# Patient Record
Sex: Female | Born: 1955 | Hispanic: Yes | Marital: Single | Smoking: Never smoker
Health system: Southern US, Community
[De-identification: ages and names within clinical notes are randomized; demographics above are authoritative.]

## PROBLEM LIST (undated history)

## (undated) DIAGNOSIS — R112 Nausea with vomiting, unspecified: Secondary | ICD-10-CM

## (undated) DIAGNOSIS — Z9889 Other specified postprocedural states: Secondary | ICD-10-CM

## (undated) DIAGNOSIS — E119 Type 2 diabetes mellitus without complications: Secondary | ICD-10-CM

## (undated) DIAGNOSIS — M1990 Unspecified osteoarthritis, unspecified site: Secondary | ICD-10-CM

## (undated) DIAGNOSIS — E785 Hyperlipidemia, unspecified: Secondary | ICD-10-CM

## (undated) DIAGNOSIS — C801 Malignant (primary) neoplasm, unspecified: Secondary | ICD-10-CM

## (undated) DIAGNOSIS — C55 Malignant neoplasm of uterus, part unspecified: Secondary | ICD-10-CM

## (undated) DIAGNOSIS — D649 Anemia, unspecified: Secondary | ICD-10-CM

## (undated) DIAGNOSIS — R0609 Other forms of dyspnea: Secondary | ICD-10-CM

## (undated) DIAGNOSIS — I10 Essential (primary) hypertension: Secondary | ICD-10-CM

## (undated) DIAGNOSIS — T8859XA Other complications of anesthesia, initial encounter: Secondary | ICD-10-CM

## (undated) DIAGNOSIS — N159 Renal tubulo-interstitial disease, unspecified: Secondary | ICD-10-CM

## (undated) HISTORY — PX: BREAST EXCISIONAL BIOPSY: SUR124

---

## 1995-08-20 HISTORY — PX: REDUCTION MAMMAPLASTY: SUR839

## 2000-08-19 HISTORY — PX: UTERINE FIBROID SURGERY: SHX826

## 2004-09-04 ENCOUNTER — Ambulatory Visit: Payer: Self-pay | Admitting: Family Medicine

## 2006-03-24 ENCOUNTER — Emergency Department: Payer: Self-pay | Admitting: Emergency Medicine

## 2006-08-26 ENCOUNTER — Ambulatory Visit: Payer: Self-pay | Admitting: Family Medicine

## 2006-09-08 ENCOUNTER — Ambulatory Visit: Payer: Self-pay | Admitting: Family Medicine

## 2007-01-20 ENCOUNTER — Ambulatory Visit: Payer: Self-pay | Admitting: Family Medicine

## 2007-10-27 ENCOUNTER — Ambulatory Visit: Payer: Self-pay | Admitting: Internal Medicine

## 2007-11-04 ENCOUNTER — Emergency Department: Payer: Self-pay | Admitting: Emergency Medicine

## 2008-05-10 ENCOUNTER — Ambulatory Visit: Payer: Self-pay | Admitting: Family Medicine

## 2008-08-04 ENCOUNTER — Emergency Department: Payer: Self-pay

## 2009-03-29 ENCOUNTER — Ambulatory Visit: Payer: Self-pay | Admitting: Gastroenterology

## 2009-05-09 ENCOUNTER — Emergency Department: Payer: Self-pay | Admitting: Emergency Medicine

## 2009-12-11 ENCOUNTER — Ambulatory Visit: Payer: Self-pay | Admitting: Family Medicine

## 2009-12-14 ENCOUNTER — Ambulatory Visit: Payer: Self-pay | Admitting: Family Medicine

## 2010-07-05 ENCOUNTER — Ambulatory Visit: Payer: Self-pay | Admitting: Family Medicine

## 2010-07-20 ENCOUNTER — Ambulatory Visit: Payer: Self-pay | Admitting: Family Medicine

## 2011-04-24 ENCOUNTER — Ambulatory Visit: Payer: Self-pay | Admitting: Family Medicine

## 2011-07-23 ENCOUNTER — Emergency Department: Payer: Self-pay | Admitting: Internal Medicine

## 2012-07-07 ENCOUNTER — Ambulatory Visit: Payer: Self-pay | Admitting: Primary Care

## 2012-09-15 ENCOUNTER — Ambulatory Visit: Payer: Self-pay | Admitting: Primary Care

## 2012-10-07 ENCOUNTER — Ambulatory Visit: Payer: Self-pay | Admitting: Surgery

## 2012-10-07 LAB — BASIC METABOLIC PANEL
BUN: 13 mg/dL (ref 7–18)
Calcium, Total: 9.2 mg/dL (ref 8.5–10.1)
Chloride: 105 mmol/L (ref 98–107)
Creatinine: 0.44 mg/dL — ABNORMAL LOW (ref 0.60–1.30)
EGFR (African American): >60
EGFR (Non-African Amer.): >60
Glucose: 101 mg/dL — ABNORMAL HIGH (ref 65–99)
Osmolality: 280 (ref 275–301)
Sodium: 140 mmol/L (ref 136–145)

## 2012-10-07 LAB — CBC WITH DIFFERENTIAL/PLATELET
Basophil #: 0.1 10*3/uL (ref 0.0–0.1)
Basophil %: 0.8 %
Eosinophil %: 2.7 %
HGB: 14 g/dL (ref 12.0–16.0)
Lymphocyte #: 2.7 10*3/uL (ref 1.0–3.6)
MCHC: 33 g/dL (ref 32.0–36.0)
MCV: 88 fL (ref 80–100)
Neutrophil #: 7.4 10*3/uL — ABNORMAL HIGH (ref 1.4–6.5)
RBC: 4.81 10*6/uL (ref 3.80–5.20)
RDW: 14 % (ref 11.5–14.5)
WBC: 11.2 10*3/uL — ABNORMAL HIGH (ref 3.6–11.0)

## 2012-10-13 ENCOUNTER — Ambulatory Visit: Payer: Self-pay | Admitting: Surgery

## 2012-10-14 LAB — PATHOLOGY REPORT

## 2013-02-16 ENCOUNTER — Ambulatory Visit: Payer: Self-pay | Admitting: Internal Medicine

## 2013-03-10 ENCOUNTER — Ambulatory Visit: Payer: Self-pay | Admitting: Family Medicine

## 2013-03-23 ENCOUNTER — Encounter: Payer: Self-pay | Admitting: Family Medicine

## 2013-04-09 ENCOUNTER — Encounter: Payer: Self-pay | Admitting: Surgery

## 2013-04-12 ENCOUNTER — Ambulatory Visit: Payer: Self-pay | Admitting: General Surgery

## 2013-04-13 LAB — WOUND AEROBIC CULTURE

## 2013-04-19 ENCOUNTER — Encounter: Payer: Self-pay | Admitting: Family Medicine

## 2013-04-19 ENCOUNTER — Encounter: Payer: Self-pay | Admitting: Surgery

## 2013-05-19 ENCOUNTER — Encounter: Payer: Self-pay | Admitting: Surgery

## 2013-09-23 ENCOUNTER — Ambulatory Visit: Payer: Self-pay | Admitting: Family Medicine

## 2014-05-12 ENCOUNTER — Encounter: Payer: Self-pay | Admitting: Surgery

## 2014-05-19 ENCOUNTER — Encounter: Payer: Self-pay | Admitting: Surgery

## 2014-06-19 ENCOUNTER — Encounter: Payer: Self-pay | Admitting: Surgery

## 2014-06-23 ENCOUNTER — Encounter: Payer: Self-pay | Admitting: General Surgery

## 2014-07-19 ENCOUNTER — Encounter: Payer: Self-pay | Admitting: Surgery

## 2014-07-19 ENCOUNTER — Encounter: Payer: Self-pay | Admitting: General Surgery

## 2014-12-09 NOTE — Op Note (Signed)
PATIENT NAME:  Martha [name], Martha [name] MR#:  [id number] DATE OF BIRTH:  [birth year]  DATE OF PROCEDURE:  10/13/2012  PREOPERATIVE DIAGNOSIS: Abnormal right mammogram.   POSTOPERATIVE DIAGNOSIS: Abnormal right mammogram.  PROCEDURE: Right needle-localized breast biopsy.   SURGEON: Phoebe Perch, MD   ASSISTANT: Erskin Burnet, PA-S   INDICATIONS: This is a patient with an abnormal mammogram and ultrasound. Preoperatively, we discussed the rationale for surgery, the options of observation, risks of bleeding, infection, recurrence, missed lesion and cosmetic deformity including hematoma and seroma. This was reviewed for her and her daughter in the preop holding area. They understood and agreed to proceed.   FINDINGS: Probable fibrocystic complex with thick sebaceous-like material in a cyst.   DESCRIPTION OF PROCEDURE: The patient was induced to general anesthesia. She was prepped and draped in a sterile fashion. VTE prophylaxis was in place.   The previously placed needle localization wire was then noted. An incision was made around the wire, and dissection down to the end of the wire was performed. A typical fibrocystic-type lesion was identified at the end of the wire with sebaceous-like material within a cyst. This was all elevated and sent off for examination. Palpation of the cavity walls demonstrated no suspicious lesions or hardness or induration to suggest additional pathology.   Additional Marcaine was placed for a total of 30 mL, and then once assuring that hemostasis was adequate, and it was checked multiple times, the wound was closed with deep sutures of 3-0 Vicryl followed by 4-0 subcuticular Monocryl. Steri-Strips, Mastisol, and sterile dressings were placed.   The patient tolerated the procedure well. There were no other complications. She was taken to the recovery room in stable condition to be discharged in the care of her family. Follow up in 10 days.   ____________________________ Jerrol Banana Burt Knack, MD rec:cb D: 10/13/2012 11:33:41 ET T: 10/13/2012 12:35:16 ET JOB#: [id number]  cc: Jerrol Banana. Burt Knack, MD, <Dictator> Florene Glen MD ELECTRONICALLY SIGNED 10/13/2012 14:07

## 2016-03-15 ENCOUNTER — Other Ambulatory Visit: Payer: Self-pay | Admitting: Family Medicine

## 2016-03-15 DIAGNOSIS — Z1231 Encounter for screening mammogram for malignant neoplasm of breast: Secondary | ICD-10-CM

## 2016-03-29 ENCOUNTER — Ambulatory Visit: Payer: Self-pay | Attending: Family Medicine

## 2016-07-10 ENCOUNTER — Encounter: Payer: Self-pay | Admitting: Emergency Medicine

## 2016-07-10 ENCOUNTER — Emergency Department: Payer: BLUE CROSS/BLUE SHIELD

## 2016-07-10 ENCOUNTER — Emergency Department
Admission: EM | Admit: 2016-07-10 | Discharge: 2016-07-10 | Disposition: A | Discharge status: Home / Self Care | Payer: BLUE CROSS/BLUE SHIELD | Attending: Emergency Medicine | Admitting: Emergency Medicine

## 2016-07-10 DIAGNOSIS — N390 Urinary tract infection, site not specified: Secondary | ICD-10-CM | POA: Insufficient documentation

## 2016-07-10 DIAGNOSIS — R109 Unspecified abdominal pain: Secondary | ICD-10-CM

## 2016-07-10 DIAGNOSIS — I10 Essential (primary) hypertension: Secondary | ICD-10-CM | POA: Diagnosis not present

## 2016-07-10 DIAGNOSIS — E119 Type 2 diabetes mellitus without complications: Secondary | ICD-10-CM | POA: Insufficient documentation

## 2016-07-10 HISTORY — DX: Type 2 diabetes mellitus without complications: E11.9

## 2016-07-10 HISTORY — DX: Essential (primary) hypertension: I10

## 2016-07-10 LAB — URINALYSIS COMPLETE WITH MICROSCOPIC (ARMC ONLY)
Bilirubin Urine: NEGATIVE
GLUCOSE, UA: 50 mg/dL — AB
KETONES UR: NEGATIVE mg/dL
NITRITE: NEGATIVE
Protein, ur: NEGATIVE mg/dL
SPECIFIC GRAVITY, URINE: 1.012 (ref 1.005–1.030)
pH: 5 (ref 5.0–8.0)

## 2016-07-10 LAB — CBC
HEMATOCRIT: 41 % (ref 35.0–47.0)
Hemoglobin: 14.1 g/dL (ref 12.0–16.0)
MCH: 29.9 pg (ref 26.0–34.0)
MCHC: 34.3 g/dL (ref 32.0–36.0)
MCV: 87.2 fL (ref 80.0–100.0)
Platelets: 272 10*3/uL (ref 150–440)
RBC: 4.71 MIL/uL (ref 3.80–5.20)
RDW: 13.9 % (ref 11.5–14.5)
WBC: 10 10*3/uL (ref 3.6–11.0)

## 2016-07-10 LAB — BASIC METABOLIC PANEL
Anion gap: 10 (ref 5–15)
BUN: 19 mg/dL (ref 6–20)
CO2: 24 mmol/L (ref 22–32)
Calcium: 9.5 mg/dL (ref 8.9–10.3)
Chloride: 102 mmol/L (ref 101–111)
Creatinine, Ser: 0.64 mg/dL (ref 0.44–1.00)
GFR calc Af Amer: >60 mL/min (ref >60)
GFR calc non Af Amer: >60 mL/min (ref >60)
GLUCOSE: 161 mg/dL — AB (ref 65–99)
POTASSIUM: 4.1 mmol/L (ref 3.5–5.1)
Sodium: 136 mmol/L (ref 135–145)

## 2016-07-10 MED ORDER — KETOROLAC TROMETHAMINE 30 MG/ML IJ SOLN
15.0000 mg | Freq: Once | INTRAMUSCULAR | Status: AC
  Administered 2016-07-10: 15 mg via INTRAVENOUS
  Filled 2016-07-10: qty 1

## 2016-07-10 MED ORDER — CEFTRIAXONE SODIUM-DEXTROSE 1-3.74 GM-% IV SOLR
1.0000 g | Freq: Once | INTRAVENOUS | Status: AC
  Administered 2016-07-10: 1 g via INTRAVENOUS
  Filled 2016-07-10: qty 50

## 2016-07-10 MED ORDER — CEPHALEXIN 500 MG PO CAPS: 500.0000 mg | ORAL_CAPSULE | Freq: Four times a day (QID) | ORAL | 0 refills | Status: AC

## 2016-07-10 MED ORDER — OXYCODONE-ACETAMINOPHEN 5-325 MG PO TABS: 1.0000 | ORAL_TABLET | Freq: Four times a day (QID) | ORAL | 0 refills | Status: DC | PRN

## 2016-07-10 MED ORDER — ONDANSETRON HCL 4 MG PO TABS: 4.0000 mg | ORAL_TABLET | Freq: Three times a day (TID) | ORAL | 0 refills | Status: DC | PRN

## 2016-07-10 MED ORDER — CEFTRIAXONE SODIUM 1 G IJ SOLR: 1.0000 g | Freq: Once | INTRAMUSCULAR | Status: DC

## 2016-07-10 NOTE — ED Provider Notes (Signed)
[facility] Emergency Department Provider Note  ____________________________________________   I have reviewed the triage vital signs and the nursing notes.   HISTORY  Chief Complaint Flank Pain (left)    HPI Martha [name] is a 60 y.o. female history per translator in my [place]. Patient presents today complaining of back pain. She has chronic back pain for which takes gabapentin and naproxen. However, this seems worse. Is more towards the flank. Denies vomiting or fever. Denies dysuria. Denies abdominal pain. Pain is sharp. It is worse when she stands or walks.The pain is on the left. Is persistent. No other systemic symptoms     Past Medical History:  Diagnosis Date  . Diabetes mellitus without complication ([place])   . HTN (hypertension)     There are no active problems to display for this patient.   History reviewed. No pertinent surgical history.  Prior to Admission medications   Not on File    Allergies Patient has no known allergies.  History reviewed. No pertinent family history.  Social History Social History  Substance Use Topics  . Smoking status: Never Smoker  . Smokeless tobacco: Never Used  . Alcohol use No    Review of Systems Constitutional: No fever/chills Eyes: No visual changes. ENT: No sore throat. No stiff neck no neck pain Cardiovascular: Denies chest pain. Respiratory: Denies shortness of breath. Gastrointestinal:   no vomiting.  No diarrhea.  No constipation. Genitourinary: Negative for dysuria. Musculoskeletal: Negative lower extremity swelling Skin: Negative for rash. Neurological: Negative for severe headaches, focal weakness or numbness. 10-point ROS otherwise negative.  ____________________________________________   PHYSICAL EXAM:  VITAL SIGNS: ED Triage Vitals  Enc Vitals Group     BP 07/10/16 2033 (!) 181/94     Pulse Rate 07/10/16 2033 78     Resp 07/10/16 2033 18     Temp 07/10/16  2033 97.7 F (36.5 C)     Temp Source 07/10/16 2033 Oral     SpO2 07/10/16 2033 100 %     Weight 07/10/16 2033 239 lb (108.4 kg)     Height 07/10/16 2033 5\' 4"  (1.626 m)     Head Circumference --      Peak Flow --      Pain Score 07/10/16 2034 10     Pain Loc --      Pain Edu? --      Excl. in [place]? --     Constitutional: Alert and oriented. Well appearing and in no acute distress. Eyes: Conjunctivae are normal. PERRL. EOMI. Head: Atraumatic. Nose: No congestion/rhinnorhea. Mouth/Throat: Mucous membranes are moist.  Oropharynx non-erythematous. Neck: No stridor.   Nontender with no meningismus Cardiovascular: Normal rate, regular rhythm. Grossly normal heart sounds.  Good peripheral circulation. Respiratory: Normal respiratory effort.  No retractions. Lungs CTAB. Abdominal: Soft and nontender. No distention. No guarding no rebound Back:  Shows lower left back pain and also some pain towards the left flank. He.  there is no midline tenderness there are no lesions noted. there is no CVA tenderness Musculoskeletal: No lower extremity tenderness, no upper extremity tenderness. No joint effusions, no DVT signs strong distal pulses no edema Neurologic:  Normal speech and language. No gross focal neurologic deficits are appreciated.  Skin:  Skin is warm, dry and intact. No rash noted. Psychiatric: Mood and affect are normal. Speech and behavior are normal.  ____________________________________________   LABS (all labs ordered are listed, but only abnormal results are displayed)  Labs Reviewed  URINALYSIS  COMPLETEWITH MICROSCOPIC ([facility] ONLY) - Abnormal; Notable for the following:       Result Value   Color, Urine YELLOW (*)    APPearance HAZY (*)    Glucose, UA 50 (*)    Hgb urine dipstick 1+ (*)    Leukocytes, UA 3+ (*)    Bacteria, UA RARE (*)    Squamous Epithelial / LPF 0-5 (*)    All other components within normal limits  BASIC METABOLIC PANEL - Abnormal; Notable for the  following:    Glucose, Bld 161 (*)    All other components within normal limits  URINE CULTURE  CBC   ____________________________________________  EKG  I personally interpreted any EKGs ordered by me or triage  ____________________________________________  RADIOLOGY  I reviewed any imaging ordered by me or triage that were performed during my shift and, if possible, patient and/or family made aware of any abnormal findings. ____________________________________________   PROCEDURES  Procedure(s) performed: None  Procedures  Critical Care performed: None  ____________________________________________   INITIAL IMPRESSION / ASSESSMENT AND PLAN / ED COURSE  Pertinent labs & imaging results that were available during my care of the patient were reviewed by me and considered in my medical decision making (see chart for details).  Patient with chronic back pain has increased pain in her back and some radiation to the flank. CT scan does not show any evidence of kidney stone. All her findings reviewed with patient. However, does appear the patient has a urinary tract infection which could be causing increased symptoms. She does not have significant flank pain or vomiting or fever or elevated white count I do think that she is a good candidate for home care for this. She is driving today so we will give her only Toradol else in her home with pain medication and antibiotics, urine culture pending. Patient very comfortable with this plan. Return precautions and follow up given understood.  Clinical Course    ____________________________________________   FINAL CLINICAL IMPRESSION(S) / ED DIAGNOSES  Final diagnoses:  None      This chart was dictated using voice recognition software.  Despite best efforts to proofread,  errors can occur which can change meaning.      Schuyler Amor, MD 07/10/16 [id number]

## 2016-07-10 NOTE — ED Triage Notes (Signed)
Pt ambulatory to triage in  NAD, report left flank pain x 2 weeks, states took OTC medicine with some relief but pain persists.  Pt denies dysuria, denies hematuria.  Pt NAD at this time.    Pt spanish speaking, request son interpret.

## 2016-07-10 NOTE — ED Notes (Addendum)
Pt. States pain is in the lt. Lower back.  Pt. States chronic pain in hips.  Pt. States recently going to [place] and had x-rays that were negative.  Pt. States stabbing pain in lt. Lower back when standing.

## 2016-07-15 LAB — URINE CULTURE

## 2016-11-25 ENCOUNTER — Ambulatory Visit
Admission: RE | Admit: 2016-11-25 | Discharge: 2016-11-25 | Disposition: A | Discharge status: Home / Self Care | Payer: BLUE CROSS/BLUE SHIELD | Source: Ambulatory Visit | Attending: Family Medicine | Admitting: Family Medicine

## 2016-11-25 ENCOUNTER — Emergency Department
Admission: EM | Admit: 2016-11-25 | Discharge: 2016-11-25 | Discharge status: Absent without leave | Payer: BLUE CROSS/BLUE SHIELD | Source: Home / Self Care

## 2016-11-25 DIAGNOSIS — Z1231 Encounter for screening mammogram for malignant neoplasm of breast: Secondary | ICD-10-CM

## 2016-11-25 HISTORY — DX: Malignant (primary) neoplasm, unspecified: C80.1

## 2017-05-21 ENCOUNTER — Other Ambulatory Visit: Payer: Self-pay | Admitting: Physical Medicine and Rehabilitation

## 2017-05-21 DIAGNOSIS — M5416 Radiculopathy, lumbar region: Secondary | ICD-10-CM

## 2017-05-27 ENCOUNTER — Ambulatory Visit
Admission: RE | Admit: 2017-05-27 | Discharge: 2017-05-27 | Disposition: A | Discharge status: Home / Self Care | Payer: BLUE CROSS/BLUE SHIELD | Source: Ambulatory Visit | Attending: Physical Medicine and Rehabilitation | Admitting: Physical Medicine and Rehabilitation

## 2017-05-27 DIAGNOSIS — M5416 Radiculopathy, lumbar region: Secondary | ICD-10-CM | POA: Insufficient documentation

## 2017-05-27 DIAGNOSIS — M4856XA Collapsed vertebra, not elsewhere classified, lumbar region, initial encounter for fracture: Secondary | ICD-10-CM | POA: Diagnosis not present

## 2017-05-27 DIAGNOSIS — M48061 Spinal stenosis, lumbar region without neurogenic claudication: Secondary | ICD-10-CM | POA: Diagnosis not present

## 2017-05-27 DIAGNOSIS — M5136 Other intervertebral disc degeneration, lumbar region: Secondary | ICD-10-CM | POA: Diagnosis not present

## 2017-11-17 ENCOUNTER — Emergency Department: Payer: BLUE CROSS/BLUE SHIELD

## 2017-11-17 ENCOUNTER — Emergency Department
Admission: EM | Admit: 2017-11-17 | Discharge: 2017-11-17 | Disposition: A | Discharge status: Home / Self Care | Payer: BLUE CROSS/BLUE SHIELD | Attending: Emergency Medicine | Admitting: Emergency Medicine

## 2017-11-17 ENCOUNTER — Encounter: Payer: Self-pay | Admitting: Emergency Medicine

## 2017-11-17 ENCOUNTER — Other Ambulatory Visit: Payer: Self-pay

## 2017-11-17 DIAGNOSIS — L03011 Cellulitis of right finger: Secondary | ICD-10-CM | POA: Diagnosis not present

## 2017-11-17 DIAGNOSIS — I10 Essential (primary) hypertension: Secondary | ICD-10-CM | POA: Diagnosis not present

## 2017-11-17 DIAGNOSIS — E119 Type 2 diabetes mellitus without complications: Secondary | ICD-10-CM | POA: Insufficient documentation

## 2017-11-17 DIAGNOSIS — M79645 Pain in left finger(s): Secondary | ICD-10-CM | POA: Diagnosis present

## 2017-11-17 MED ORDER — LIDOCAINE HCL (PF) 1 % IJ SOLN
30.0000 mL | Freq: Once | INTRAMUSCULAR | Status: AC
  Administered 2017-11-17: 30 mL
  Filled 2017-11-17: qty 30

## 2017-11-17 MED ORDER — LIDOCAINE HCL (PF) 1 % IJ SOLN
INTRAMUSCULAR | Status: AC
  Administered 2017-11-17: 30 mL
  Filled 2017-11-17: qty 5

## 2017-11-17 MED ORDER — OXYCODONE-ACETAMINOPHEN 5-325 MG PO TABS
2.0000 | ORAL_TABLET | Freq: Once | ORAL | Status: AC
  Administered 2017-11-17: 2 via ORAL
  Filled 2017-11-17: qty 2

## 2017-11-17 MED ORDER — MUPIROCIN 2 % EX OINT: TOPICAL_OINTMENT | CUTANEOUS | 0 refills | Status: AC

## 2017-11-17 NOTE — ED Provider Notes (Signed)
[facility] Emergency Department Provider Note       Time seen: ----------------------------------------- 10:52 AM on 11/17/2017 -----------------------------------------   I have reviewed the triage vital signs and the nursing notes.  HISTORY   Chief Complaint Finger Injury    HPI Martha [name] is a 62 y.o. female with a history of uterine cancer, diabetes and hypertension who presents to the ED for middle finger pain for the past 3 weeks.  Patient was given a week of antibiotics but has not helped.  She complains of pain from the fingertip of the whole arm.  Swelling and discoloration was noted to the right third digit distally.  Past Medical History:  Diagnosis Date  . Cancer ([place])    uterine  . Diabetes mellitus without complication ([place])   . HTN (hypertension)     There are no active problems to display for this patient.   Past Surgical History:  Procedure Laterality Date  . BREAST BIOPSY Right    neg  . REDUCTION MAMMAPLASTY Bilateral 1997    Allergies Patient has no known allergies.  Social History Social History   Tobacco Use  . Smoking status: Never Smoker  . Smokeless tobacco: Never Used  Substance Use Topics  . Alcohol use: No  . Drug use: Not on file    Review of Systems Constitutional: Negative for fever. Gastrointestinal: Negative for abdominal pain, vomiting and diarrhea. Genitourinary: Negative for dysuria. Musculoskeletal: Positive for pain in the hand and index finger with swelling Skin: Index finger swelling And erythema Neurological: Negative for headaches, focal weakness or numbness.  All systems negative/normal/unremarkable except as stated in the HPI  ____________________________________________   PHYSICAL EXAM:  VITAL SIGNS: ED Triage Vitals  Enc Vitals Group     BP 11/17/17 0933 (!) 170/95     Pulse Rate 11/17/17 0933 82     Resp 11/17/17 0933 18     Temp 11/17/17 0933 97.8 F (36.6 C)      Temp Source 11/17/17 0933 Oral     SpO2 11/17/17 0933 98 %     Weight 11/17/17 0937 227 lb (103 kg)     Height 11/17/17 0937 5\' 4"  (1.626 m)     Head Circumference --      Peak Flow --      Pain Score --      Pain Loc --      Pain Edu? --      Excl. in [place]? --    Constitutional: Alert and oriented. Well appearing and in no distress. Cardiovascular: Normal rate, regular rhythm. No murmurs, rubs, or gallops. Respiratory: Normal respiratory effort without tachypnea nor retractions. Breath sounds are clear and equal bilaterally. No wheezes/rales/rhonchi. Musculoskeletal: Tenderness over the distal aspect of the index finger on the right hand Neurologic:  Normal speech and language. No gross focal neurologic deficits are appreciated.  Skin: Minimal finger erythema and tenderness distally Psychiatric: Mood and affect are normal. Speech and behavior are normal.  ____________________________________________  ED COURSE:  As part of my medical decision making, I reviewed the following data within the [place] History obtained from family if available, nursing notes, old chart and ekg, as well as notes from prior ED visits. Patient presented for right index finger pain, we will assess with imaging as indicated at this time.   Marland Kitchen.Incision and Drainage Date/Time: 11/17/2017 11:58 AM Performed by: Earleen Newport, MD Authorized by: Earleen Newport, MD   Consent:    Consent  obtained:  Verbal   Consent given by:  Patient   Risks discussed:  Bleeding, infection, incomplete drainage and pain   Alternatives discussed:  Alternative treatment, delayed treatment and observation Location:    Type:  Abscess   Location:  Upper extremity   Upper extremity location:  Finger   Finger location:  R long finger Pre-procedure details:    Skin preparation:  Betadine Anesthesia (see MAR for exact dosages):    Anesthesia method:  Local infiltration   Local anesthetic:   Lidocaine 1% w/o epi Procedure type:    Complexity:  Simple Procedure details:    Incision types:  Stab incision   Incision depth:  Subungual   Scalpel blade:  11   Wound management:  Probed and deloculated   Drainage:  Purulent   Drainage amount:  Moderate   Wound treatment:  Wound left open   Packing materials:  None Post-procedure details:    Patient tolerance of procedure:  Tolerated well, no immediate complications   ____________________________________________   RADIOLOGY Images were viewed by me  Right third digit x-rays are unremarkable for acute process  ____________________________________________  DIFFERENTIAL DIAGNOSIS   Paronychia, felon, cellulitis, OA  FINAL ASSESSMENT AND PLAN  Paronychia   Plan: The patient had presented for middle finger pain and swelling that has been persistent.  I did send a wound culture. Patient's imaging is unremarkable.  She is stable for outpatient follow-up.   Laurence Aly, MD   Note: This note was generated in part or whole with voice recognition software. Voice recognition is usually quite accurate but there are transcription errors that can and very often do occur. I apologize for any typographical errors that were not detected and corrected.    Earleen Newport, MD 11/17/17 1158

## 2017-11-17 NOTE — ED Notes (Signed)
Pt ambulatory to wheel chair upon discharge. Interpreter Marlen present for discharge. Pt verbalized understanding of discharge instructions, follow-up care and prescription. VSS. Skin warm and dry. A&O x4. Pt states she is sleepy and will wait in lobby for daughter to pick her up rather than driving herself home. This RN wheeled pt to lobby.

## 2017-11-17 NOTE — ED Triage Notes (Addendum)
Pt c/o pain to right 3rd digit for 3 weeks.  Was given one week of abx but has not helped.  C/o pain from finger tip up whole arm.  Swelling and dark discoloration of nail to 3rd digit right hand noted. Subjective fevers/chills. Hx DM but pt reports controlled by tea.

## 2017-11-24 LAB — AEROBIC/ANAEROBIC CULTURE (SURGICAL/DEEP WOUND)

## 2017-11-24 LAB — AEROBIC/ANAEROBIC CULTURE W GRAM STAIN (SURGICAL/DEEP WOUND)

## 2017-11-25 NOTE — Progress Notes (Signed)
ED Culture Report Follow up  Pt seen in ED 4/1 with wound cx from finger growing citrobacter, klebsiella spp, peptostreptococcus spp. No antibiotics were prescribed. Spoke with Dr. Jimmye Norman who asked writer to check on pt for continued sxs and if continued sx to call in Bactrim.   Spoke to pt via Administrator, sports. Pt denies pain, swelling, drainage. Feels a little weak but no fever and no chills. Pt states she went to her family doctor and her MD stated the finger is healed and all better. MD also told her that she will likely lose her nail when the new one comes in. No antibiotics were called in; pt in agreement and states she is all better. Informed pt that if she does get fever or chills to return to the emergency department.

## 2017-12-11 IMAGING — CT CT RENAL STONE PROTOCOL
2 of 4 series · 16 of 46 positions shown, 18 images · non-contrast
Comparison: CT of the abdomen and pelvis from 02/16/2013, and MRI
of the lumbar spine performed 03/10/2013

CLINICAL DATA: Subacute onset of left flank pain. Initial
encounter.

EXAM:
CT ABDOMEN AND PELVIS WITHOUT CONTRAST
TECHNIQUE: Multidetector CT imaging of the abdomen and pelvis was performed
following the standard protocol without IV contrast.

[Series 2: axial st · axial · 0.92mm/px · z∈[-733,-313]mm · 13 of 94 slices shown, 15 images]
[im 5/94  soft-tissue]
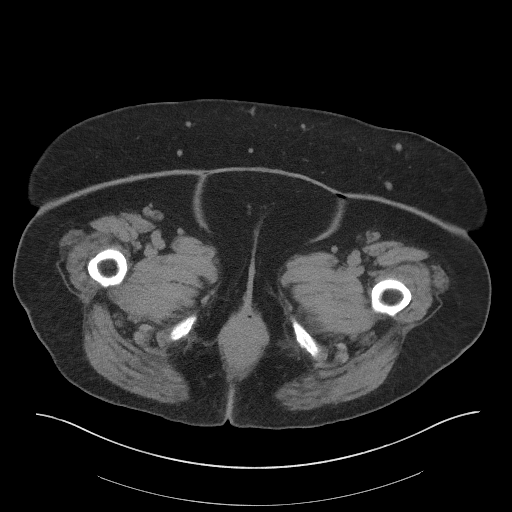
[im 5/94  bone]
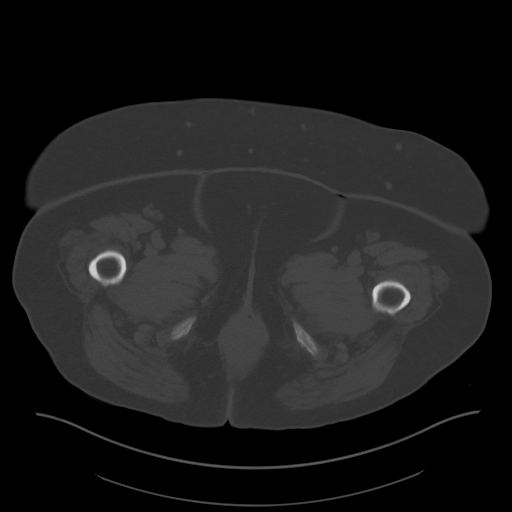
[im 13/94  soft-tissue]
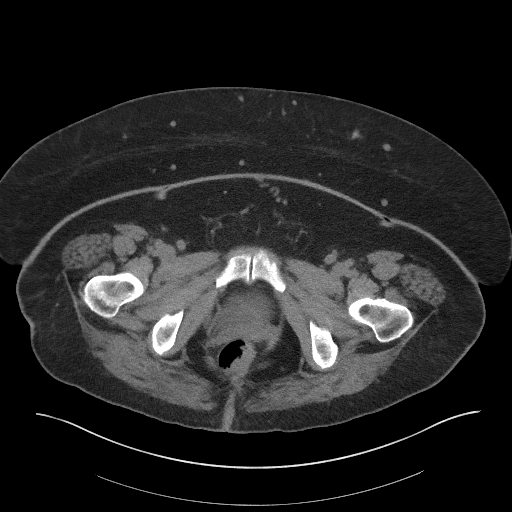
[im 21/94  soft-tissue]
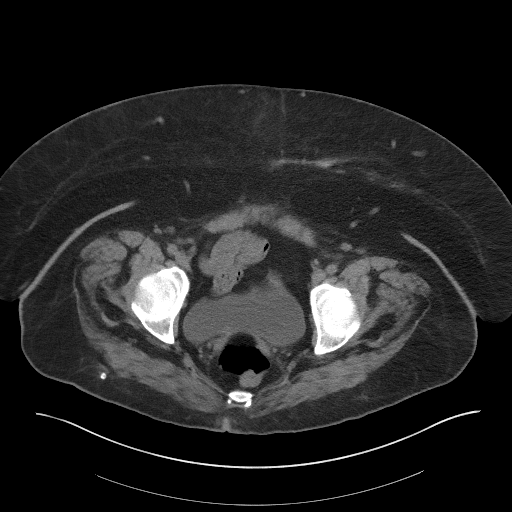
[im 25/94  soft-tissue]
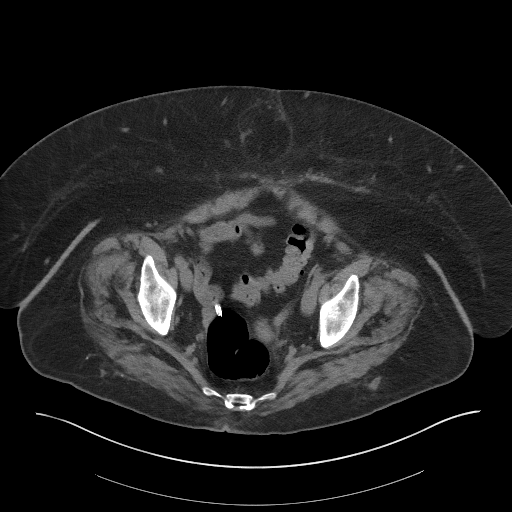
[im 33/94  soft-tissue]
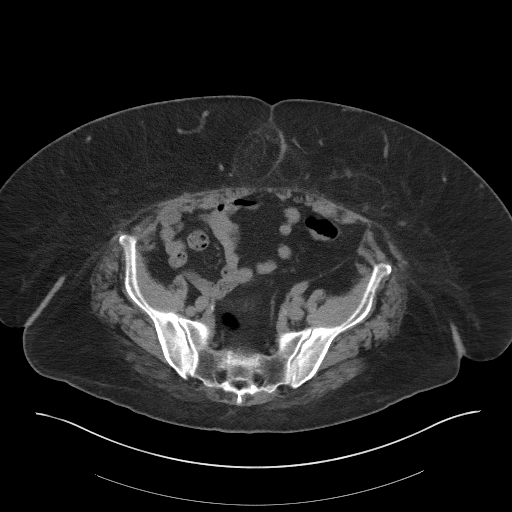
[im 41/94  soft-tissue]
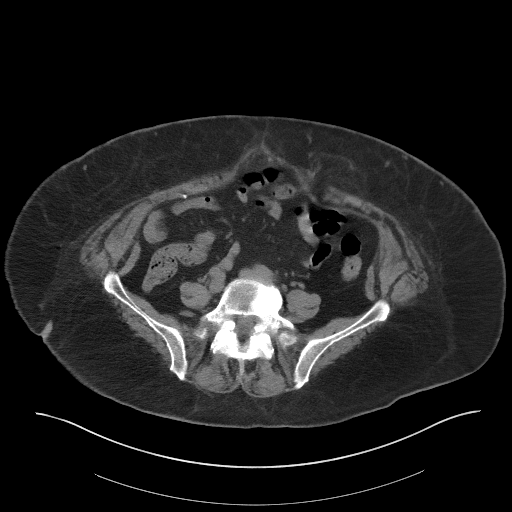
[im 49/94  soft-tissue]
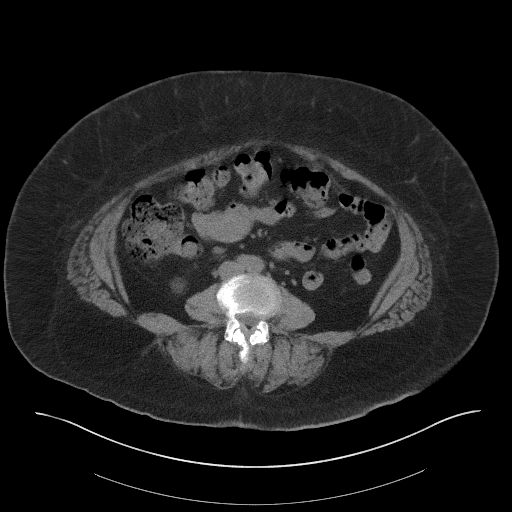
[im 53/94  soft-tissue]
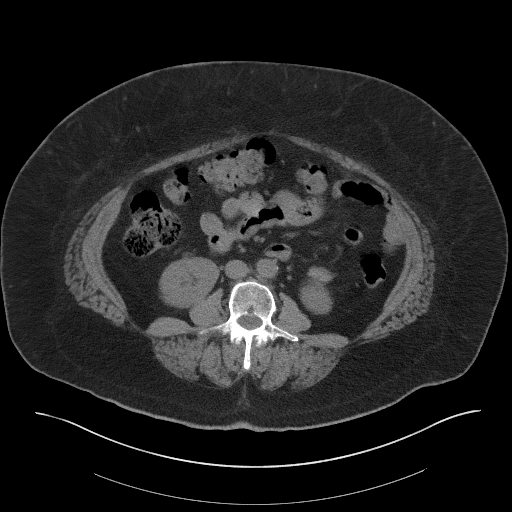
[im 61/94  soft-tissue]
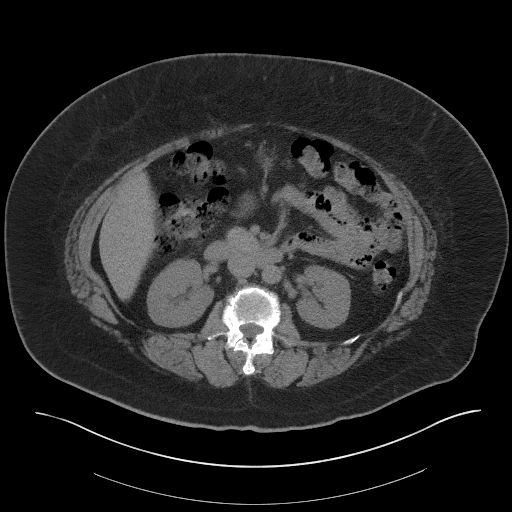
[im 61/94  bone]
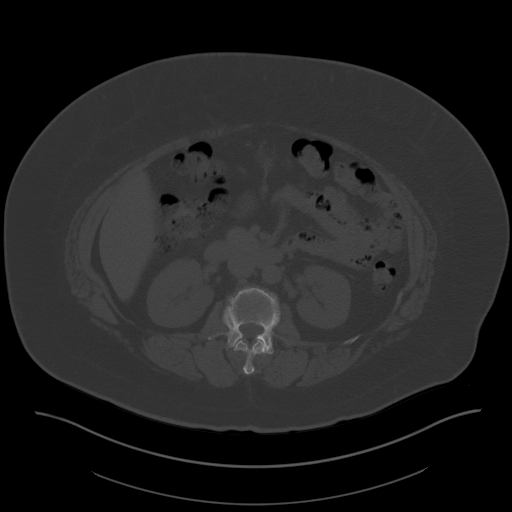
[im 69/94  soft-tissue]
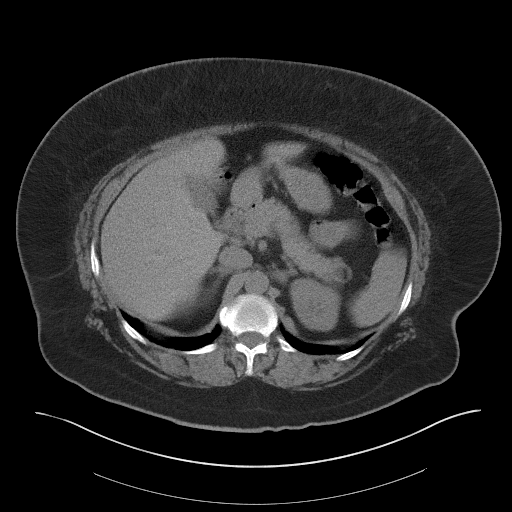
[im 73/94  soft-tissue]
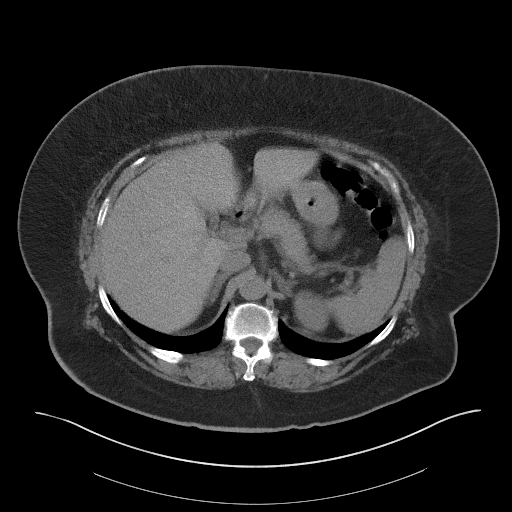
[im 81/94  soft-tissue]
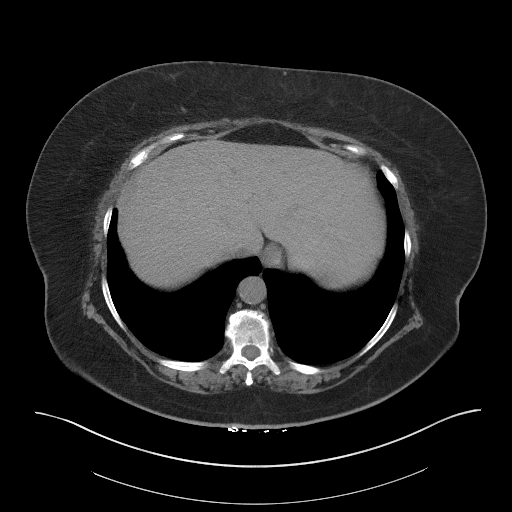
[im 89/94  soft-tissue]
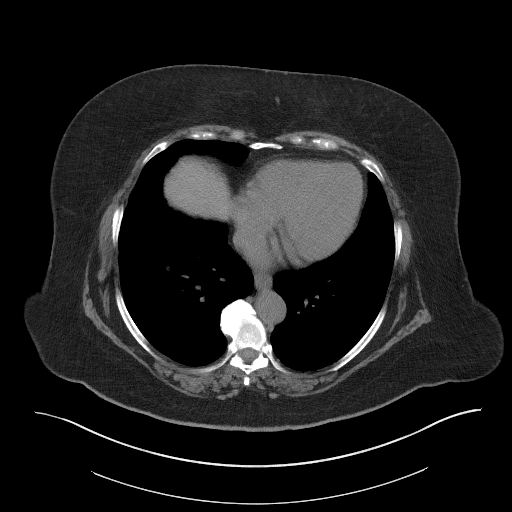

[Series 5: coronal · coronal · 0.82mm/px · 3 of 157 slices shown]
[im 53/157  soft-tissue]
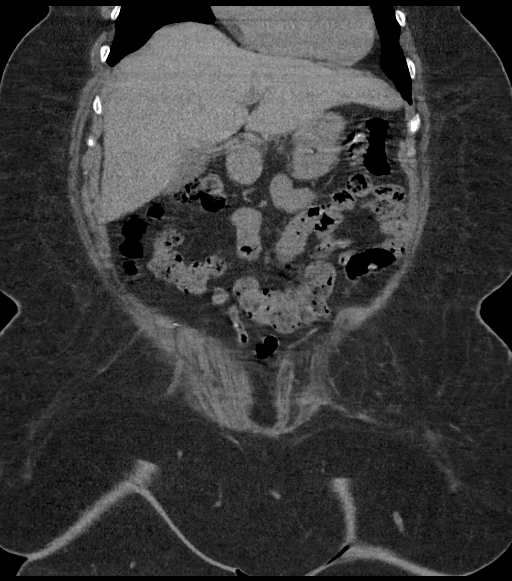
[im 70/157  soft-tissue]
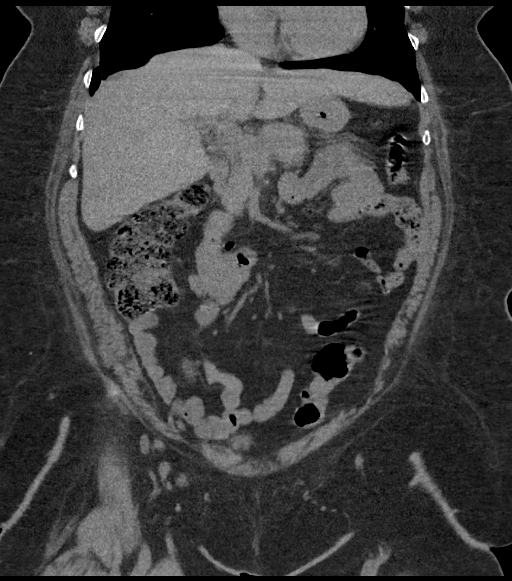
[im 87/157  soft-tissue]
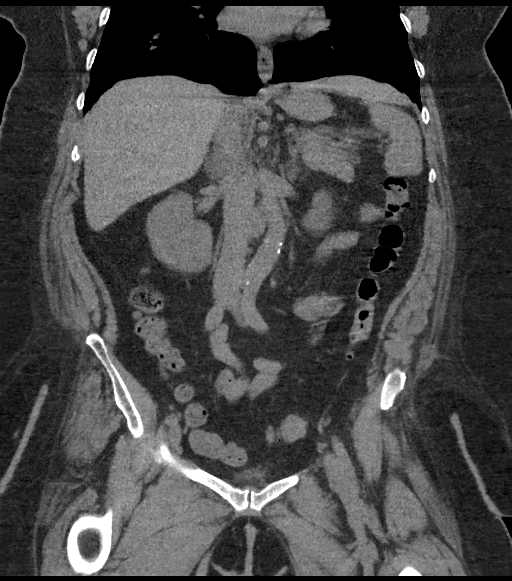

[16 of 46 positions shown; findings below may reference images not displayed]

FINDINGS: Lower chest: The visualized lung bases are grossly clear. The
visualized portions of the mediastinum are unremarkable.

Hepatobiliary: The liver is unremarkable in appearance. The
gallbladder is unremarkable in appearance. The common bile duct
remains normal in caliber.

Pancreas: The pancreas is within normal limits.

Spleen: The spleen is unremarkable in appearance.

Adrenals/Urinary Tract: The adrenal glands are unremarkable in
appearance.

Right renal cysts are noted. The kidneys are otherwise unremarkable.
There is no evidence of hydronephrosis. No renal or ureteral stones
are identified. No perinephric stranding is seen.

Stomach/Bowel: The stomach is unremarkable in appearance. The small
bowel is within normal limits. The appendix is normal in caliber,
without evidence of appendicitis. The colon is unremarkable in
appearance.

Vascular/Lymphatic: Mild scattered calcification is seen along the
abdominal aorta and its branches. The abdominal aorta is otherwise
grossly unremarkable. The inferior vena cava is grossly
unremarkable. No retroperitoneal lymphadenopathy is seen. No pelvic
sidewall lymphadenopathy is identified.

Reproductive: The bladder is mildly distended. The patient is status
post hysterectomy. No suspicious adnexal masses are seen. The
ovaries are relatively symmetric.

Other: Two moderate to large anterior abdominal wall hernias are
noted, one adjacent to the umbilicus and the other at the left lower
quadrant. These contain only fat. There is no evidence of bowel
herniation.

Musculoskeletal: No acute osseous abnormalities are identified.
There is grade 2 anterolisthesis of L5 on S1, reflecting chronic
bilateral pars defects at L5. Underlying vacuum phenomenon is noted.
The visualized musculature is unremarkable in appearance.
IMPRESSION: 1. No acute abnormality seen within the abdomen or pelvis.
2. Two moderate to large anterior abdominal wall hernias, one
adjacent to the umbilicus and the other at the left lower quadrant,
containing only fat.
3. Right renal cysts noted.
4. Mild scattered aortic atherosclerosis.
5. Grade 2 anterolisthesis of L5 on S1, reflecting chronic bilateral
pars defects at L5.

## 2018-05-05 ENCOUNTER — Other Ambulatory Visit: Payer: Self-pay | Admitting: Family Medicine

## 2018-05-05 DIAGNOSIS — Z1231 Encounter for screening mammogram for malignant neoplasm of breast: Secondary | ICD-10-CM

## 2018-05-27 ENCOUNTER — Ambulatory Visit
Admission: RE | Admit: 2018-05-27 | Discharge: 2018-05-27 | Disposition: A | Discharge status: Home / Self Care | Payer: BLUE CROSS/BLUE SHIELD | Source: Ambulatory Visit | Attending: Family Medicine | Admitting: Family Medicine

## 2018-05-27 DIAGNOSIS — Z1231 Encounter for screening mammogram for malignant neoplasm of breast: Secondary | ICD-10-CM | POA: Diagnosis not present

## 2018-07-03 ENCOUNTER — Other Ambulatory Visit: Payer: Self-pay | Admitting: Orthopedic Surgery

## 2018-07-03 DIAGNOSIS — G8929 Other chronic pain: Secondary | ICD-10-CM

## 2018-07-03 DIAGNOSIS — M25562 Pain in left knee: Principal | ICD-10-CM

## 2018-07-03 DIAGNOSIS — M170 Bilateral primary osteoarthritis of knee: Secondary | ICD-10-CM

## 2018-07-03 DIAGNOSIS — M25561 Pain in right knee: Principal | ICD-10-CM

## 2018-07-24 ENCOUNTER — Ambulatory Visit
Admission: RE | Admit: 2018-07-24 | Discharge: 2018-07-24 | Disposition: A | Discharge status: Home / Self Care | Payer: BLUE CROSS/BLUE SHIELD | Source: Ambulatory Visit | Attending: Orthopedic Surgery | Admitting: Orthopedic Surgery

## 2018-07-24 DIAGNOSIS — M25562 Pain in left knee: Secondary | ICD-10-CM | POA: Diagnosis present

## 2018-07-24 DIAGNOSIS — M170 Bilateral primary osteoarthritis of knee: Secondary | ICD-10-CM | POA: Diagnosis present

## 2018-07-24 DIAGNOSIS — M25561 Pain in right knee: Secondary | ICD-10-CM | POA: Diagnosis present

## 2018-07-24 DIAGNOSIS — G8929 Other chronic pain: Secondary | ICD-10-CM | POA: Diagnosis present

## 2018-08-19 DIAGNOSIS — U071 COVID-19: Secondary | ICD-10-CM

## 2018-08-19 HISTORY — DX: COVID-19: U07.1

## 2019-07-09 ENCOUNTER — Emergency Department: Payer: BC Managed Care – PPO | Attending: Physician Assistant

## 2019-07-09 ENCOUNTER — Other Ambulatory Visit: Payer: Self-pay

## 2019-07-09 ENCOUNTER — Emergency Department
Admission: EM | Admit: 2019-07-09 | Discharge: 2019-07-09 | Disposition: A | Discharge status: Home / Self Care | Payer: BC Managed Care – PPO | Attending: Student | Admitting: Student

## 2019-07-09 ENCOUNTER — Encounter: Payer: Self-pay | Admitting: Intensive Care

## 2019-07-09 ENCOUNTER — Emergency Department: Payer: BC Managed Care – PPO

## 2019-07-09 DIAGNOSIS — I10 Essential (primary) hypertension: Secondary | ICD-10-CM | POA: Insufficient documentation

## 2019-07-09 DIAGNOSIS — Y990 Civilian activity done for income or pay: Secondary | ICD-10-CM | POA: Insufficient documentation

## 2019-07-09 DIAGNOSIS — Z8541 Personal history of malignant neoplasm of cervix uteri: Secondary | ICD-10-CM | POA: Insufficient documentation

## 2019-07-09 DIAGNOSIS — Y929 Unspecified place or not applicable: Secondary | ICD-10-CM | POA: Diagnosis not present

## 2019-07-09 DIAGNOSIS — X500XXA Overexertion from strenuous movement or load, initial encounter: Secondary | ICD-10-CM | POA: Insufficient documentation

## 2019-07-09 DIAGNOSIS — Z79899 Other long term (current) drug therapy: Secondary | ICD-10-CM | POA: Insufficient documentation

## 2019-07-09 DIAGNOSIS — Z7982 Long term (current) use of aspirin: Secondary | ICD-10-CM | POA: Insufficient documentation

## 2019-07-09 DIAGNOSIS — E119 Type 2 diabetes mellitus without complications: Secondary | ICD-10-CM | POA: Diagnosis not present

## 2019-07-09 DIAGNOSIS — S4991XA Unspecified injury of right shoulder and upper arm, initial encounter: Secondary | ICD-10-CM | POA: Insufficient documentation

## 2019-07-09 DIAGNOSIS — Y9389 Activity, other specified: Secondary | ICD-10-CM | POA: Diagnosis not present

## 2019-07-09 MED ORDER — MELOXICAM 15 MG PO TABS: 15.0000 mg | ORAL_TABLET | Freq: Every day | ORAL | 2 refills | Status: AC

## 2019-07-09 NOTE — ED Triage Notes (Addendum)
Needs interpretor. Patient fell at work yesterday and hurt right shoulder. Patient asking for interpretor to explain what workmans comp is

## 2019-07-09 NOTE — ED Provider Notes (Signed)
[facility] Emergency Department Provider Note  ____________________________________________   First MD Initiated Contact with Patient 07/09/19 1454     (approximate)  I have reviewed the triage vital signs and the nursing notes.   HISTORY  Chief Complaint Shoulder Injury (right)    HPI Martha [name] is a 63 y.o. female presents emergency department complaint of right shoulder pain.  States she was at work and was lifting a jar and felt a large pop or snap in the shoulder.  Unable to reach overhead.  Large amount of pain at the right humerus.  No fever or chills.  No chest pain shortness of breath.  No other injuries.    Past Medical History:  Diagnosis Date  . Cancer ([place])    uterine  . Diabetes mellitus without complication ([place])   . HTN (hypertension)     There are no active problems to display for this patient.   Past Surgical History:  Procedure Laterality Date  . BREAST EXCISIONAL BIOPSY Right    neg  . REDUCTION MAMMAPLASTY Bilateral 1997    Prior to Admission medications   Medication Sig Start Date End Date Taking? Authorizing Provider  aspirin EC 81 MG tablet Take 81 mg by mouth daily.   Yes [provider]  losartan (COZAAR) 100 MG tablet Take 100 mg by mouth daily.   Yes [provider]  meloxicam (MOBIC) 15 MG tablet Take 1 tablet (15 mg total) by mouth daily. 07/09/19 07/08/20  Versie Starks, PA-C    Allergies Patient has no known allergies.  Family History  Problem Relation Age of Onset  . Breast cancer Sister     Social History Social History   Tobacco Use  . Smoking status: Never Smoker  . Smokeless tobacco: Never Used  Substance Use Topics  . Alcohol use: No  . Drug use: Not on file    Review of Systems  Constitutional: No fever/chills Eyes: No visual changes. ENT: No sore throat. Respiratory: Denies cough Genitourinary: Negative for dysuria. Musculoskeletal: Negative for  back pain.  Positive for right shoulder Skin: Negative for rash.    ____________________________________________   PHYSICAL EXAM:  VITAL SIGNS: ED Triage Vitals  Enc Vitals Group     BP 07/09/19 1410 (!) 180/84     Pulse Rate 07/09/19 1410 71     Resp 07/09/19 1410 16     Temp 07/09/19 1410 98 F (36.7 C)     Temp Source 07/09/19 1410 Oral     SpO2 07/09/19 1410 97 %     Weight 07/09/19 1411 230 lb (104.3 kg)     Height 07/09/19 1411 5\' 4"  (1.626 m)     Head Circumference --      Peak Flow --      Pain Score 07/09/19 1411 10     Pain Loc --      Pain Edu? --      Excl. in [place]? --     Constitutional: Alert and oriented. Well appearing and in no acute distress. Eyes: Conjunctivae are normal.  Head: Atraumatic. Nose: No congestion/rhinnorhea. Mouth/Throat: Mucous membranes are moist.   Neck:  supple no lymphadenopathy noted Cardiovascular: Normal rate, regular rhythm. l Respiratory: Normal respiratory effort.  No retractions,  GU: deferred Musculoskeletal: Decreased range of motion of the right shoulder, right shoulder and upper humerus tender to palpation.  Neurovascular is intact  neurologic:  Normal speech and language.  Skin:  Skin is warm, dry and intact.  No rash noted. Psychiatric: Mood and affect are normal. Speech and behavior are normal.  ____________________________________________   LABS (all labs ordered are listed, but only abnormal results are displayed)  Labs Reviewed - No data to display ____________________________________________   ____________________________________________  RADIOLOGY  X-ray of the right shoulder is negative  ____________________________________________   PROCEDURES  Procedure(s) performed: Sling applied by nursing staff  Procedures    ____________________________________________   INITIAL IMPRESSION / ASSESSMENT AND PLAN / ED COURSE  Pertinent labs & imaging results that were available during my care of the  patient were reviewed by me and considered in my medical decision making (see chart for details).   Patient 63 year old female presents emergency department with complaints of right shoulder pain.  See HPI  Physical exam shows her right shoulder to be tender and swollen.  X-ray of the right shoulder is negative  ----------------------------------------- 4:17 PM on 07/09/2019 -----------------------------------------  Right shoulder x-ray is negative. Explained findings to the patient.  Is given prescription for meloxicam.  Placed in sling.  She is apply ice.  Follow-up with orthopedics.  She was instructed to notify her work so that they can provide her with a Sport and exercise psychologist. physician.  I did give her Dr. Clydell Hakim phone number for follow-up if she is unable to have her work schedule her.  Explained to her she would end up having to pay the bill if he is not approved by her workers comp.  States she understands will comply.  Patient was discharged in stable condition with a sling, prescription for meloxicam, and work note stating no use of the right arm until seen by orthopedics.    Martha [name] was evaluated in Emergency Department on 07/09/2019 for the symptoms described in the history of present illness. She was evaluated in the context of the global COVID-19 pandemic, which necessitated consideration that the patient might be at risk for infection with the SARS-CoV-2 virus that causes COVID-19. Institutional protocols and algorithms that pertain to the evaluation of patients at risk for COVID-19 are in a state of rapid change based on information released by regulatory bodies including the CDC and federal and state organizations. These policies and algorithms were followed during the patient's care in the ED.   As part of my medical decision making, I reviewed the following radiograph, nurses notes, old chart, data within the [place] Notes from prior ED visits and  [place] Controlled Substance Database  ____________________________________________   FINAL CLINICAL IMPRESSION(S) / ED DIAGNOSES  Final diagnoses:  Right shoulder injury, initial encounter      NEW MEDICATIONS STARTED DURING THIS VISIT:  New Prescriptions   MELOXICAM (MOBIC) 15 MG TABLET    Take 1 tablet (15 mg total) by mouth daily.     Note:  This document was prepared using Dragon voice recognition software and may include unintentional dictation errors.    Versie Starks, PA-C 07/09/19 1619    Lilia Pro., MD 07/10/19 [phone]

## 2019-07-09 NOTE — ED Notes (Signed)
See triage note  States she fell yesterday at work  Having pain to right shoudler

## 2019-07-09 NOTE — Discharge Instructions (Signed)
Follow-up with either Dr. Marry Guan or a physician of your Worker's Comp. choice.  Make sure whoever you see is verified by your work.  Otherwise she will end up paying the bill.  Take the medication as prescribed.  Apply ice to the right shoulder.  Wear the sling until evaluated by orthopedics.  Return to the emergency department if worsening.

## 2020-11-14 ENCOUNTER — Other Ambulatory Visit: Payer: Self-pay | Admitting: Family Medicine

## 2020-11-14 DIAGNOSIS — Z1231 Encounter for screening mammogram for malignant neoplasm of breast: Secondary | ICD-10-CM

## 2020-11-23 ENCOUNTER — Emergency Department: Payer: Medicaid Other

## 2020-11-23 ENCOUNTER — Other Ambulatory Visit: Payer: Self-pay

## 2020-11-23 ENCOUNTER — Emergency Department
Admission: EM | Admit: 2020-11-23 | Discharge: 2020-11-23 | Disposition: A | Discharge status: Home / Self Care | Payer: Medicaid Other | Attending: Emergency Medicine | Admitting: Emergency Medicine

## 2020-11-23 DIAGNOSIS — I10 Essential (primary) hypertension: Secondary | ICD-10-CM | POA: Insufficient documentation

## 2020-11-23 DIAGNOSIS — R059 Cough, unspecified: Secondary | ICD-10-CM | POA: Diagnosis not present

## 2020-11-23 DIAGNOSIS — M5414 Radiculopathy, thoracic region: Secondary | ICD-10-CM | POA: Insufficient documentation

## 2020-11-23 DIAGNOSIS — Z79899 Other long term (current) drug therapy: Secondary | ICD-10-CM | POA: Insufficient documentation

## 2020-11-23 DIAGNOSIS — Z7982 Long term (current) use of aspirin: Secondary | ICD-10-CM | POA: Diagnosis not present

## 2020-11-23 DIAGNOSIS — Z8542 Personal history of malignant neoplasm of other parts of uterus: Secondary | ICD-10-CM | POA: Insufficient documentation

## 2020-11-23 DIAGNOSIS — R0789 Other chest pain: Secondary | ICD-10-CM | POA: Insufficient documentation

## 2020-11-23 DIAGNOSIS — E119 Type 2 diabetes mellitus without complications: Secondary | ICD-10-CM | POA: Insufficient documentation

## 2020-11-23 DIAGNOSIS — M549 Dorsalgia, unspecified: Secondary | ICD-10-CM | POA: Diagnosis present

## 2020-11-23 LAB — BASIC METABOLIC PANEL WITH GFR
Anion gap: 11 (ref 5–15)
BUN: 14 mg/dL (ref 8–23)
CO2: 24 mmol/L (ref 22–32)
Calcium: 9.5 mg/dL (ref 8.9–10.3)
Chloride: 103 mmol/L (ref 98–111)
Creatinine, Ser: 0.79 mg/dL (ref 0.44–1.00)
GFR, Estimated: >60 mL/min
Glucose, Bld: 267 mg/dL — ABNORMAL HIGH (ref 70–99)
Potassium: 4.3 mmol/L (ref 3.5–5.1)
Sodium: 138 mmol/L (ref 135–145)

## 2020-11-23 LAB — CBC
HCT: 42.7 % (ref 36.0–46.0)
Hemoglobin: 14.1 g/dL (ref 12.0–15.0)
MCH: 28.1 pg (ref 26.0–34.0)
MCHC: 33 g/dL (ref 30.0–36.0)
MCV: 85.2 fL (ref 80.0–100.0)
Platelets: 265 K/uL (ref 150–400)
RBC: 5.01 MIL/uL (ref 3.87–5.11)
RDW: 14 % (ref 11.5–15.5)
WBC: 10.6 K/uL — ABNORMAL HIGH (ref 4.0–10.5)
nRBC: 0 % (ref 0.0–0.2)

## 2020-11-23 LAB — TROPONIN I (HIGH SENSITIVITY)
Troponin I (High Sensitivity): 4 ng/L (ref <18)
Troponin I (High Sensitivity): 4 ng/L (ref <18)

## 2020-11-23 MED ORDER — LIDOCAINE 5 % EX PTCH
1.0000 | MEDICATED_PATCH | Freq: Once | CUTANEOUS | Status: DC
  Administered 2020-11-23: 1 via TRANSDERMAL
  Filled 2020-11-23: qty 1

## 2020-11-23 MED ORDER — IOHEXOL 350 MG/ML SOLN
75.0000 mL | Freq: Once | INTRAVENOUS | Status: AC | PRN
  Administered 2020-11-23: 75 mL via INTRAVENOUS

## 2020-11-23 MED ORDER — LIDOCAINE 5 % EX PTCH: 1.0000 | MEDICATED_PATCH | Freq: Two times a day (BID) | CUTANEOUS | 0 refills | Status: DC

## 2020-11-23 MED ORDER — MORPHINE SULFATE (PF) 4 MG/ML IV SOLN
4.0000 mg | Freq: Once | INTRAVENOUS | Status: AC
  Administered 2020-11-23: 4 mg via INTRAVENOUS
  Filled 2020-11-23: qty 1

## 2020-11-23 NOTE — ED Notes (Signed)
Provided cup of ice water per patient request.

## 2020-11-23 NOTE — ED Provider Notes (Signed)
[facility] Emergency Department Provider Note   ____________________________________________   Event Date/Time   First MD Initiated Contact with Patient 11/23/20 1610     (approximate)  I have reviewed the triage vital signs and the nursing notes.   HISTORY  Chief Complaint Chest Pain and Back Pain    HPI Martha [name] is a 65 y.o. female with past medical history of hypertension, diabetes, and uterine cancer presents to the ED complaining of chest and back pain.  Patient reports that she has had 2 weeks of increasing pain that starts in the middle of her back and radiates around both sides.  Pain is sharp and worse when she takes a deep breath, is also associated with some difficulty catching her breath.  She endorses a dry cough but denies any fevers, nausea, vomiting, numbness, or weakness.  She reports a long history of back issues, but pain today is more severe and it does not usually involve her chest.  She has not noticed any pain or swelling in her legs and denies any history of DVT/PE.        Past Medical History:  Diagnosis Date  . Cancer ([place])    uterine  . Diabetes mellitus without complication ([place])   . HTN (hypertension)     There are no problems to display for this patient.   Past Surgical History:  Procedure Laterality Date  . BREAST EXCISIONAL BIOPSY Right    neg  . REDUCTION MAMMAPLASTY Bilateral 1997    Prior to Admission medications   Medication Sig Start Date End Date Taking? Authorizing Provider  lidocaine (LIDODERM) 5 % Place 1 patch onto the skin every 12 (twelve) hours. Remove & Discard patch within 12 hours or as directed by MD 11/23/20 11/23/21 Yes Blake Divine, MD  aspirin EC 81 MG tablet Take 81 mg by mouth daily.    [provider]  losartan (COZAAR) 100 MG tablet Take 100 mg by mouth daily.    [provider]    Allergies Patient has no known allergies.  Family History  Problem Relation  Age of Onset  . Breast cancer Sister     Social History Social History   Tobacco Use  . Smoking status: Never Smoker  . Smokeless tobacco: Never Used  Substance Use Topics  . Alcohol use: No    Review of Systems  Constitutional: No fever/chills Eyes: No visual changes. ENT: No sore throat. Cardiovascular: Positive for chest pain. Respiratory: Positive for cough and shortness of breath. Gastrointestinal: No abdominal pain.  No nausea, no vomiting.  No diarrhea.  No constipation. Genitourinary: Negative for dysuria. Musculoskeletal: Positive for back pain. Skin: Negative for rash. Neurological: Negative for headaches, focal weakness or numbness.  ____________________________________________   PHYSICAL EXAM:  VITAL SIGNS: ED Triage Vitals  Enc Vitals Group     BP 11/23/20 1557 (!) 130/98     Pulse Rate 11/23/20 1557 98     Resp 11/23/20 1557 (!) 22     Temp 11/23/20 1557 (!) 97.4 F (36.3 C)     Temp Source 11/23/20 1557 Oral     SpO2 11/23/20 1557 93 %     Weight 11/23/20 1556 210 lb (95.3 kg)     Height 11/23/20 1556 5\' 4"  (1.626 m)     Head Circumference --      Peak Flow --      Pain Score 11/23/20 1555 10     Pain Loc --  Pain Edu? --      Excl. in [place]? --     Constitutional: Alert and oriented. Eyes: Conjunctivae are normal. Head: Atraumatic. Nose: No congestion/rhinnorhea. Mouth/Throat: Mucous membranes are moist. Neck: Normal ROM Cardiovascular: Normal rate, regular rhythm. Grossly normal heart sounds.  2+ radial and DP pulses bilaterally. Respiratory: Normal respiratory effort.  No retractions. Lungs CTAB.  No chest wall tenderness to palpation. Gastrointestinal: Soft and nontender. No distention. Genitourinary: deferred Musculoskeletal: No lower extremity tenderness nor edema.  Midline thoracic spinal tenderness, no cervical or lumbar spinal tenderness to palpation. Neurologic:  Normal speech and language. No gross focal neurologic deficits are  appreciated. Skin:  Skin is warm, dry and intact. No rash noted. Psychiatric: Mood and affect are normal. Speech and behavior are normal.  ____________________________________________   LABS (all labs ordered are listed, but only abnormal results are displayed)  Labs Reviewed  BASIC METABOLIC PANEL - Abnormal; Notable for the following components:      Result Value   Glucose, Bld 267 (*)    All other components within normal limits  CBC - Abnormal; Notable for the following components:   WBC 10.6 (*)    All other components within normal limits  TROPONIN I (HIGH SENSITIVITY)  TROPONIN I (HIGH SENSITIVITY)   ____________________________________________  EKG  ED ECG REPORT I, Blake Divine, the attending physician, personally viewed and interpreted this ECG.   Date: 11/23/2020  EKG Time: 16:16  Rate: 104  Rhythm: Sinus arrhythmia  Axis: Normal  Intervals:none  ST&T Change: None   PROCEDURES  Procedure(s) performed (including Critical Care):  Procedures   ____________________________________________   INITIAL IMPRESSION / ASSESSMENT AND PLAN / ED COURSE       65 year old female with past medical history of hypertension, diabetes, uterine cancer who presents to the ED complaining of 2 weeks of gradually worsening pain starting in her upper back and radiating around her chest along with some difficulty catching her breath.  Symptoms seem most consistent with a thoracic radiculopathy, however with involvement of her chest along with difficulty catching her breath, we will further assess for PE with CT scan.  EKG shows no evidence of arrhythmia or ischemia and I have a low suspicion for ACS.  Troponin is pending but if this is negative I doubt ACS given her 2 weeks of symptoms.  We will treat pain with morphine and Lidoderm patch for now.  CTA chest is negative for PE or other acute process, 2 sets of troponin are negative.  Low suspicion for ACS given patient's heart  score of less than 4 and she is appropriate for discharge home with PCP follow-up.  I suspect her symptoms are due to a thoracic radiculopathy, she has been prescribed NSAIDs and gabapentin by her PCP, we will add Lidoderm patch.  She was counseled to follow-up with her PCP and to return to the ED for new worsening symptoms, patient agrees with plan.      ____________________________________________   FINAL CLINICAL IMPRESSION(S) / ED DIAGNOSES  Final diagnoses:  Thoracic radiculopathy  Atypical chest pain     ED Discharge Orders         Ordered    lidocaine (LIDODERM) 5 %  Every 12 hours        11/23/20 1931           Note:  This document was prepared using Dragon voice recognition software and may include unintentional dictation errors.   Blake Divine, MD 11/23/20 [phone]

## 2020-11-23 NOTE — ED Notes (Signed)
Provided discharge instructions. Verbalized understanding. Wheeled to exit.

## 2020-11-23 NOTE — ED Triage Notes (Addendum)
Pt has been having chest pain for 2 weeks and it has recently been going into her back- pt states it starts in her ribs under her right breast and describes it as a burning- pt denies n/v and SHOB- pt states when she tales a deep breath it goes into her back

## 2021-06-05 ENCOUNTER — Ambulatory Visit (INDEPENDENT_AMBULATORY_CARE_PROVIDER_SITE_OTHER): Payer: Medicaid Other | Admitting: Dermatology

## 2021-06-05 ENCOUNTER — Other Ambulatory Visit: Payer: Self-pay

## 2021-06-05 DIAGNOSIS — D2239 Melanocytic nevi of other parts of face: Secondary | ICD-10-CM

## 2021-06-05 DIAGNOSIS — D1801 Hemangioma of skin and subcutaneous tissue: Secondary | ICD-10-CM

## 2021-06-05 DIAGNOSIS — D485 Neoplasm of uncertain behavior of skin: Secondary | ICD-10-CM

## 2021-06-05 NOTE — Patient Instructions (Addendum)
Wound Care Instructions  Cleanse wound gently with soap and water once a day then pat dry with clean gauze. Apply a thing coat of Petrolatum (petroleum jelly, "Vaseline") over the wound (unless you have an allergy to this). We recommend that you use a new, sterile tube of Vaseline. Do not pick or remove scabs. Do not remove the yellow or white "healing tissue" from the base of the wound.  Cover the wound with fresh, clean, nonstick gauze and secure with paper tape. You may use Band-Aids in place of gauze and tape if the would is small enough, but would recommend trimming much of the tape off as there is often too much. Sometimes Band-Aids can irritate the skin.  You should call the office for your biopsy report after 1 week if you have not already been contacted.  If you experience any problems, such as abnormal amounts of bleeding, swelling, significant bruising, significant pain, or evidence of infection, please call the office immediately.  FOR ADULT SURGERY PATIENTS: If you need something for pain relief you may take 1 extra strength Tylenol (acetaminophen) AND 2 Ibuprofen (200mg each) together every 4 hours as needed for pain. (do not take these if you are allergic to them or if you have a reason you should not take them.) Typically, you may only need pain medication for 1 to 3 days.   If you have any questions or concerns for your doctor, please call our main line at [phone] and press option 4 to reach your doctor's medical assistant. If no one answers, please leave a voicemail as directed and we will return your call as soon as possible. Messages left after 4 pm will be answered the following business day.   You may also send us a message via MyChart. We typically respond to MyChart messages within 1-2 business days.  For prescription refills, please ask your pharmacy to contact our office. Our fax number is [phone].  If you have an urgent issue when the clinic is closed that  cannot wait until the next business day, you can page your doctor at the number below.    Please note that while we do our best to be available for urgent issues outside of office hours, we are not available 24/7.   If you have an urgent issue and are unable to reach us, you may choose to seek medical care at your doctor's office, retail clinic, urgent care center, or emergency room.  If you have a medical emergency, please immediately call 911 or go to the emergency department.  Pager Numbers  - Dr. Kowalski: [phone]  - Dr. Moye: [phone]  - Dr. Stewart: [phone]  In the event of inclement weather, please call our main line at [phone] for an update on the status of any delays or closures.  Dermatology Medication Tips: Please keep the boxes that topical medications come in in order to help keep track of the instructions about where and how to use these. Pharmacies typically print the medication instructions only on the boxes and not directly on the medication tubes.   If your medication is too expensive, please contact our office at [phone] option 4 or send us a message through MyChart.   We are unable to tell what your co-pay for medications will be in advance as this is different depending on your insurance coverage. However, we may be able to find a substitute medication at lower cost or fill out paperwork to get insurance to cover a needed   medication.   If a prior authorization is required to get your medication covered by your insurance company, please allow us 1-2 business days to complete this process.  Drug prices often vary depending on where the prescription is filled and some pharmacies may offer cheaper prices.  The website www.goodrx.com contains coupons for medications through different pharmacies. The prices here do not account for what the cost may be with help from insurance (it may be cheaper with your insurance), but the website can give you the  price if you did not use any insurance.  - You can print the associated coupon and take it with your prescription to the pharmacy.  - You may also stop by our office during regular business hours and pick up a GoodRx coupon card.  - If you need your prescription sent electronically to a different pharmacy, notify our office through [place] MyChart or by phone at [phone] option 4.   

## 2021-06-05 NOTE — Progress Notes (Signed)
   New Patient Visit  Subjective  Martha Martha [name] is a 65 y.o. female who presents for the following: Spots (Right cheek x 2. Dark spot came up after Covid in 2020, itchy and growing. Raised area below the dark spot is growing. ).  Both are itchy and bothersome.    The following portions of the chart were reviewed this encounter and updated as appropriate:       Review of Systems:  No other skin or systemic complaints except as noted in HPI or Assessment and Plan.  Objective  Well appearing patient in no apparent distress; mood and affect are within normal limits.  A focused examination was performed including face. Relevant physical exam findings are noted in the Assessment and Plan.  L mid cheek 7.0 mm flesh papule     L zygoma 3.33mm violaceous papule      Assessment & Plan  Hemangiomas - Red papules, including L upper eyelid, L parietal scalp - Discussed benign nature - Observe - Call for any changes  Melanocytic Nevi - Martha [name]-flesh-colored symmetric papules face - Benign appearing on exam today - Observation - Call clinic for new or changing moles - Recommend daily use of broad spectrum spf 30+ sunscreen to sun-exposed areas.    Neoplasm of uncertain behavior of skin (2) L mid cheek  Epidermal / dermal shaving  Lesion diameter (cm):  0.8 Informed consent: discussed and consent obtained   Patient was prepped and draped in usual sterile fashion: Area prepped with alcohol. Anesthesia: the lesion was anesthetized in a standard fashion   Anesthetic:  1% lidocaine w/ epinephrine 1-100,000 buffered w/ 8.4% NaHCO3 Instrument used: flexible razor blade   Hemostasis achieved with: pressure, aluminum chloride and electrodesiccation   Outcome: patient tolerated procedure well   Post-procedure details: wound care instructions given   Post-procedure details comment:  Ointment and small bandage applied  Specimen 1 - Surgical pathology Differential Diagnosis:  Irritated Nevus vs other Check Margins: No 7.0 mm flesh papule  L zygoma  Epidermal / dermal shaving  Lesion diameter (cm):  0.4 Informed consent: discussed and consent obtained   Patient was prepped and draped in usual sterile fashion: Area prepped with alcohol. Anesthesia: the lesion was anesthetized in a standard fashion   Anesthetic:  1% lidocaine w/ epinephrine 1-100,000 buffered w/ 8.4% NaHCO3 Instrument used: flexible razor blade   Hemostasis achieved with: pressure, aluminum chloride and electrodesiccation   Outcome: patient tolerated procedure well   Post-procedure details: wound care instructions given   Post-procedure details comment:  Ointment and small bandage applied  Specimen 2 - Surgical pathology Differential Diagnosis: Irritated Hemangioma vs other Check Margins: No 3.68mm violaceous papule  Discussed resulting small scar with shave removal, and possible recurrence of lesion.  Recommend vaseline ointment to area daily and cover until healed.  Recommend photoprotection/sunscreen to area to prevent discoloration of scar.  Once healed, may apply OTC Serica scar gel bid to thickened scars.   Return if symptoms worsen or fail to improve.  IJamesetta Orleans, CMA, am acting as scribe for Brendolyn Patty, MD .  Documentation: I have reviewed the above documentation for accuracy and completeness, and I agree with the above.  Brendolyn Patty MD

## 2021-06-11 ENCOUNTER — Telehealth: Payer: Self-pay

## 2021-06-11 NOTE — Telephone Encounter (Signed)
Left pt msg to call for bx results/sh 

## 2021-06-11 NOTE — Telephone Encounter (Signed)
-----   Message from Brendolyn Patty, MD sent at 06/11/2021 12:39 PM EDT ----- 1. Skin , left mid cheek MELANOCYTIC NEVUS, INTRADERMAL TYPE, IRRITATED 2. Skin , left zygoma HEMANGIOMA, IRRITATED  1. Benign irritated mole 2. Benign irritated hemangioma "blood mole"   - please call patient

## 2021-06-13 ENCOUNTER — Telehealth: Payer: Self-pay

## 2021-06-13 NOTE — Telephone Encounter (Signed)
-----   Message from Brendolyn Patty, MD sent at 06/11/2021 12:39 PM EDT ----- 1. Skin , left mid cheek MELANOCYTIC NEVUS, INTRADERMAL TYPE, IRRITATED 2. Skin , left zygoma HEMANGIOMA, IRRITATED  1. Benign irritated mole 2. Benign irritated hemangioma "blood mole"   - please call patient

## 2021-06-13 NOTE — Telephone Encounter (Signed)
Spoke to patients daughter, Wilhemena Durie and advised of bx results.

## 2021-06-22 ENCOUNTER — Other Ambulatory Visit: Payer: Self-pay | Admitting: Family Medicine

## 2021-06-22 DIAGNOSIS — Z1231 Encounter for screening mammogram for malignant neoplasm of breast: Secondary | ICD-10-CM

## 2021-12-07 ENCOUNTER — Other Ambulatory Visit: Payer: Self-pay | Admitting: Family Medicine

## 2021-12-07 DIAGNOSIS — Z1231 Encounter for screening mammogram for malignant neoplasm of breast: Secondary | ICD-10-CM

## 2021-12-17 ENCOUNTER — Other Ambulatory Visit: Payer: Self-pay | Admitting: Orthopedic Surgery

## 2021-12-25 ENCOUNTER — Encounter: Payer: Medicare HMO | Attending: Physician Assistant | Admitting: Physician Assistant

## 2021-12-25 DIAGNOSIS — I10 Essential (primary) hypertension: Secondary | ICD-10-CM | POA: Diagnosis not present

## 2021-12-25 DIAGNOSIS — E11622 Type 2 diabetes mellitus with other skin ulcer: Secondary | ICD-10-CM | POA: Diagnosis not present

## 2021-12-25 DIAGNOSIS — I87312 Chronic venous hypertension (idiopathic) with ulcer of left lower extremity: Secondary | ICD-10-CM | POA: Diagnosis not present

## 2021-12-25 DIAGNOSIS — M170 Bilateral primary osteoarthritis of knee: Secondary | ICD-10-CM | POA: Insufficient documentation

## 2021-12-25 DIAGNOSIS — I872 Venous insufficiency (chronic) (peripheral): Secondary | ICD-10-CM | POA: Insufficient documentation

## 2021-12-25 DIAGNOSIS — L97822 Non-pressure chronic ulcer of other part of left lower leg with fat layer exposed: Secondary | ICD-10-CM | POA: Insufficient documentation

## 2021-12-25 NOTE — Progress Notes (Signed)
KIANDRIA, CLUM ([id number]) ?Visit Report for 12/25/2021 ?Chief Complaint Document Details ?Patient Name: Martha [name], Martha [name] ?Date of Service: 12/25/2021 12:45 PM ?Medical Record Number: [id number] ?Patient Account Number: [id number] ?Date of Birth/Sex: [birth year] (66 y.o. F) ?Treating RN: Levora Dredge ?Primary Care Provider: Denton Lank Other Clinician: ?Referring Provider: Denton Lank ?Treating Provider/Extender: Jeri Cos ?Weeks in Treatment: 0 ?Information Obtained from: Patient ?Chief Complaint ?Left LE Ulcer ?Electronic Signature(s) ?Signed: 12/25/2021 1:37:38 PM By: Worthy Keeler PA-C ?Entered By: Worthy Keeler on 12/25/2021 13:37:38 ?MARRAH, VANEVERY ([id number]) ?-------------------------------------------------------------------------------- ?Debridement Details ?Patient Name: Martha, [name] ?Date of Service: 12/25/2021 12:45 PM ?Medical Record Number: [id number] ?Patient Account Number: [id number] ?Date of Birth/Sex: Oct 05, 1955 (66 y.o. F) ?Treating RN: Levora Dredge ?Primary Care Provider: Denton Lank Other Clinician: ?Referring Provider: Denton Lank ?Treating Provider/Extender: Jeri Cos ?Weeks in Treatment: 0 ?Debridement Performed for ?Wound #4 Left,Lateral Lower Leg ?Assessment: ?Performed By: Physician Tommie Sams., PA-C ?Debridement Type: Debridement ?Severity of Tissue Pre Debridement: Fat layer exposed ?Level of Consciousness (Pre- ?Awake and Alert ?procedure): ?Pre-procedure Verification/Time Out ?Yes - 13:48 ?Taken: ?Total Area Debrided (L x W): 0.9 (cm) x 1 (cm) = 0.9 (cm?) ?Tissue and other material ?Viable, Non-Viable, Slough, Subcutaneous, [place] ?debrided: ?Level: Skin/Subcutaneous Tissue ?Debridement Description: Excisional ?Instrument: Curette ?Bleeding: Minimum ?Hemostasis Achieved: Pressure ?Response to Treatment: Procedure was tolerated well ?Level of Consciousness (Post- ?Awake and Alert ?procedure): ?Post Debridement Measurements of Total  Wound ?Length: (cm) 0.9 ?Width: (cm) 1 ?Depth: (cm) 0.2 ?Volume: (cm?) 0.141 ?Character of Wound/Ulcer Post Debridement: Stable ?Severity of Tissue Post Debridement: Fat layer exposed ?Post Procedure Diagnosis ?Same as Pre-procedure ?Electronic Signature(s) ?Signed: 12/25/2021 4:57:35 PM By: Levora Dredge ?Signed: 12/25/2021 5:21:07 PM By: Worthy Keeler PA-C ?Entered By: Levora Dredge on 12/25/2021 13:49:04 ?KHERINGTON, MERAZ ([id number]) ?-------------------------------------------------------------------------------- ?HPI Details ?Patient Name: [name], Martha ?Date of Service: 12/25/2021 12:45 PM ?Medical Record Number: [id number] ?Patient Account Number: [id number] ?Date of Birth/Sex: 1955-08-26 (66 y.o. F) ?Treating RN: Levora Dredge ?Primary Care Provider: Denton Lank Other Clinician: ?Referring Provider: Denton Lank ?Treating Provider/Extender: Jeri Cos ?Weeks in Treatment: 0 ?History of Present Illness ?HPI Description: 12/25/2021 upon evaluation today patient appears to be doing okay in regard to her wound on the lateral aspect of her left leg. ?This is something that she tells me is somewhat recurrent. She does have a history of lower extremity edema. With that being said she tells me a ?lot of these areas she just takes care of overall and has done well last time she was seen here in the office was actually in 2015. She is seen with ?her daughter today and with the help of an interpreter we were able to evaluate her effectively today. The patient does tell me that her most ?recent hemoglobin A1c was 8.0 this was last week however the provider that she sees is not in our system. She also tells me that she has been ?using Neosporin to dress the wound with an Dial soap to clean although occasionally if it itches she will use alcohol. With that being said I do not ?see any evidence of infection at this time which is great news she was placed on Bactrim DS and subsequently had what sounds to be  a mild ?anaphylactic reaction which is not good we have added that to her list of allergies here in the clinic. Otherwise I do believe she is going to be able ?to effectively get this healed but she is having some issues here with an upcoming  surgery for her right knee to replace that knee and then ?subsequently she will be doing the left knee as well following. The right knee surgery is scheduled for June 1 3 peripherally honest I am not sure ?we will have this healed by then I am not sure the surgeon is and want to proceed as long she has an open wound although that will be left up to ?Dr. Rudene Christians who is her surgeon. ?The patient does have a history of hypertension, diabetes mellitus type 2, chronic venous insufficiency, and bilateral knee osteoarthritis requiring ?arthroplasty. ?Electronic Signature(s) ?Signed: 12/25/2021 2:31:28 PM By: Worthy Keeler PA-C ?Entered By: Worthy Keeler on 12/25/2021 14:31:28 ?OMNIA, DOLLINGER ([id number]) ?-------------------------------------------------------------------------------- ?Physical Exam Details ?Patient Name: Martha, [name] ?Date of Service: 12/25/2021 12:45 PM ?Medical Record Number: [id number] ?Patient Account Number: [id number] ?Date of Birth/Sex: 27-Apr-1956 (66 y.o. F) ?Treating RN: Levora Dredge ?Primary Care Provider: Denton Lank Other Clinician: ?Referring Provider: Denton Lank ?Treating Provider/Extender: Jeri Cos ?Weeks in Treatment: 0 ?Constitutional ?patient is hypertensive.. pulse regular and within target range for patient.Marland Kitchen respirations regular, non-labored and within target range for patient.. ?temperature within target range for patient.. Obese and well-hydrated in no acute distress. ?Eyes ?conjunctiva clear no eyelid edema noted. pupils equal round and reactive to light and accommodation. ?Ears, Nose, Mouth, and Throat ?no gross abnormality of ear auricles or external auditory canals. normal hearing noted during conversation. mucus  membranes moist. ?Respiratory ?normal breathing without difficulty. ?Cardiovascular ?1+ dorsalis pedis/posterior tibialis pulses. 1+ pitting edema of the bilateral lower extremities. ?Musculoskeletal ?normal gait and posture. no significant deformity or arthritic changes, no loss or range of motion, no clubbing. ?Psychiatric ?this patient is able to make decisions and demonstrates good insight into disease process. Alert and Oriented x 3. pleasant and cooperative. ?Notes ?Upon inspection patient's wound bed actually showed signs of having some slough and biofilm noted on the surface of the wound. I was able to ?clean this away with a #3 curette patient tolerated this today without complication and postdebridement wound bed appears to be doing better. I ?do think there is some definite connective tissue noted here and I do believe that the patient is going to be able to effectively heal this but it is ?going to take some time. I do believe compression therapy could help I am really not certain that we will be able to get her healed by the time ?that she is supposed to be getting her surgery. ?Electronic Signature(s) ?Signed: 12/25/2021 2:32:07 PM By: Worthy Keeler PA-C ?Entered By: Worthy Keeler on 12/25/2021 14:32:06 ?ELAURA, CALIX ([id number]) ?-------------------------------------------------------------------------------- ?Physician Orders Details ?Patient Name: YEZENIA, FREDRICK ?Date of Service: 12/25/2021 12:45 PM ?Medical Record Number: [id number] ?Patient Account Number: [id number] ?Date of Birth/Sex: [birth year] (66 y.o. 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 ?o N

## 2021-12-25 NOTE — Progress Notes (Signed)
SUKHMAN, KOCHER ([id number]) ?Visit Report for 12/25/2021 ?Abuse Risk Screen Details ?Patient Name: Martha [name], Martha [name] ?Date of Service: 12/25/2021 12:45 PM ?Medical Record Number: [id number] ?Patient Account Number: [id number] ?Date of Birth/Sex: 1955/10/01 (66 y.o. F) ?Treating RN: Levora Dredge ?Primary Care Akua Blethen: Denton Lank Other Clinician: ?Referring Tayanna Talford: Denton Lank ?Treating Jeda Pardue/Extender: Jeri Cos ?Weeks in Treatment: 0 ?Abuse Risk Screen Items ?Answer ?ABUSE RISK SCREEN: ?Has anyone close to you tried to hurt or harm you recentlyo No ?Do you feel uncomfortable with anyone in your familyo No ?Has anyone forced you do things that you didnot want to doo No ?Electronic Signature(s) ?Signed: 12/25/2021 4:57:35 PM By: Levora Dredge ?Entered By: Levora Dredge on 12/25/2021 13:08:06 ?NEELAM, TIGGS ([id number]) ?-------------------------------------------------------------------------------- ?Activities of Daily Living Details ?Patient Name: Martha [name], Martha [name] ?Date of Service: 12/25/2021 12:45 PM ?Medical Record Number: [id number] ?Patient Account Number: [id number] ?Date of Birth/Sex: 1956-03-14 (66 y.o. F) ?Treating RN: Levora Dredge ?Primary Care Briston Lax: Denton Lank Other Clinician: ?Referring Eulalia Ellerman: Denton Lank ?Treating Nella Botsford/Extender: Jeri Cos ?Weeks in Treatment: 0 ?Activities of Daily Living Items ?Answer ?Activities of Daily Living (Please select one for each item) ?[place] ?Take Medications Completely Able ?Use Telephone Completely Able ?Care for Appearance Completely Able ?Use Toilet Completely Able ?Bath / Shower Completely Able ?Dress Self Completely Able ?Feed Self Completely Able ?Walk Completely Able ?Get In / Out Bed Completely Able ?Housework Completely Able ?Prepare Meals Completely Able ?Handle Money Completely Able ?Shop for Self Completely Able ?Electronic Signature(s) ?Signed: 12/25/2021 4:57:35 PM By: Levora Dredge ?Entered By: Levora Dredge on 12/25/2021 13:08:36 ?JEDA, PARDUE ([id number]) ?-------------------------------------------------------------------------------- ?Education Screening Details ?Patient Name: Martha [name], Martha [name] ?Date of Service: 12/25/2021 12:45 PM ?Medical Record Number: [id number] ?Patient Account Number: [id number] ?Date of Birth/Sex: 02/16/1956 (66 y.o. F) ?Treating RN: Levora Dredge ?Primary Care Lillien Petronio: Denton Lank Other Clinician: ?Referring Onyx Edgley: Denton Lank ?Treating Shelsea Hangartner/Extender: Jeri Cos ?Weeks in Treatment: 0 ?Learning Preferences/Education Level/Primary Language ?Learning Preference: Explanation, Demonstration, Video, Communication Board, Printed Material ?Preferred Language: ?Other: Spanish ?Cognitive Barrier ?Language Barrier: Yes spanish ?Translator Needed: No ?Memory Deficit: No ?Emotional Barrier: No ?Cultural/Religious Beliefs Affecting Medical Care: No ?Physical Barrier ?Impaired Vision: No ?Impaired Hearing: No ?Decreased Hand dexterity: No ?Knowledge/Comprehension ?Knowledge Level: High ?Comprehension Level: High ?Ability to understand written instructions: High ?Ability to understand verbal instructions: High ?Motivation ?Anxiety Level: Calm ?Cooperation: Cooperative ?Education Importance: Acknowledges Need ?Interest in Health Problems: Asks Questions ?Perception: Coherent ?Willingness to Engage in Self-Management ?High ?Activities: ?Readiness to Engage in Self-Management ?High ?Activities: ?Electronic Signature(s) ?Signed: 12/25/2021 4:57:35 PM By: Levora Dredge ?Entered By: Levora Dredge on 12/25/2021 13:09:13 ?YESSIKA, OTTE ([id number]) ?-------------------------------------------------------------------------------- ?Fall Risk Assessment Details ?Patient Name: Martha [name], Martha [name] ?Date of Service: 12/25/2021 12:45 PM ?Medical Record Number: [id number] ?Patient Account Number: [id number] ?Date of Birth/Sex: Aug 15, 1956 (66 y.o.  F) ?Treating RN: Levora Dredge ?Primary Care Tinnie Kunin: Denton Lank Other Clinician: ?Referring Rodell Marrs: Denton Lank ?Treating Shenise Wolgamott/Extender: Jeri Cos ?Weeks in Treatment: 0 ?Fall Risk Assessment Items ?Have you had 2 or more falls in the last 12 monthso 0 No ?Have you had any fall that resulted in injury in the last 12 monthso 0 No ?FALLS RISK SCREEN ?History of falling - immediate or within 3 months 0 No ?Secondary diagnosis (Do you have 2 or more medical diagnoseso) 0 No ?Ambulatory aid ?None/bed rest/wheelchair/nurse 0 No ?Crutches/cane/walker 15 Yes ?Furniture 0 No ?Intravenous therapy Access/Saline/Heparin Lock 0 No ?Gait/Transferring ?Normal/ bed rest/ wheelchair 0 Yes ?Weak (short steps with or without shuffle, stooped but  able to lift head while walking, may ?0 No ?seek support from furniture) ?Impaired (short steps with shuffle, may have difficulty arising from chair, head down, impaired ?0 No ?balance) ?Mental Status ?Oriented to own ability 0 Yes ?Electronic Signature(s) ?Signed: 12/25/2021 4:57:35 PM By: Levora Dredge ?Entered By: Levora Dredge on 12/25/2021 13:09:40 ?Martha [name], Martha [name] ([id number]) ?-------------------------------------------------------------------------------- ?Foot Assessment Details ?Patient Name: Martha [name], Martha [name] ?Date of Service: 12/25/2021 12:45 PM ?Medical Record Number: [id number] ?Patient Account Number: [id number] ?Date of Birth/Sex: 1956/03/09 (66 y.o. F) ?Treating RN: Levora Dredge ?Primary Care Analiyah Lechuga: Denton Lank Other Clinician: ?Referring Diedre Maclellan: Denton Lank ?Treating Charlesia Canaday/Extender: Jeri Cos ?Weeks in Treatment: 0 ?Foot Assessment Items ?Site Locations ?+ = Sensation present, - = Sensation absent, C = Callus, U = Ulcer ?R = Redness, W = Warmth, M = Maceration, PU = Pre-ulcerative lesion ?F = Fissure, S = Swelling, D = Dryness ?Assessment ?Right: Left: ?Other Deformity: No No ?Prior Foot Ulcer: No No ?Prior Amputation: No No ?Charcot  Joint: No No ?Ambulatory Status: Ambulatory With Help ?Assistance Device: Cane ?Gait: Steady ?Electronic Signature(s) ?Signed: 12/25/2021 4:57:35 PM By: Levora Dredge ?Entered By: Levora Dredge on 12/25/2021 13:17:26 ?Martha [name], Martha [name] ([id number]) ?-------------------------------------------------------------------------------- ?Nutrition Risk Screening Details ?Patient Name: Martha [name], Martha [name] ?Date of Service: 12/25/2021 12:45 PM ?Medical Record Number: [id number] ?Patient Account Number: [id number] ?Date of Birth/Sex: [birth year] (66 y.o. F) ?Treating RN: Levora Dredge ?Primary Care Satya Buttram: Denton Lank Other Clinician: ?Referring Shaelynn Dragos: Denton Lank ?Treating Jaedan Schuman/Extender: Jeri Cos ?Weeks in Treatment: 0 ?Height (in): 64 ?Weight (lbs): 236 ?Body Mass Index (BMI): 40.5 ?Nutrition Risk Screening Items ?Score Screening ?NUTRITION RISK SCREEN: ?I have an illness or condition that made me change the kind and/or amount of food I eat 0 No ?I eat fewer than two meals per day 0 No ?I eat few fruits and vegetables, or milk products 0 No ?I have three or more drinks of beer, liquor or wine almost every day 0 No ?I have tooth or mouth problems that make it hard for me to eat 0 No ?I don't always have enough money to buy the food I need 0 No ?I eat alone most of the time 0 No ?I take three or more different prescribed or over-the-counter drugs a day 0 No ?Without wanting to, I have lost or gained 10 pounds in the last six months 0 No ?I am not always physically able to shop, cook and/or feed myself 0 No ?Nutrition Protocols ?Good Risk Protocol 0 No interventions needed ?Moderate Risk Protocol ?High Risk Proctocol ?Risk Level: Good Risk ?Score: 0 ?Notes ?pt states does not eat much meat ?Electronic Signature(s) ?Signed: 12/25/2021 4:57:35 PM By: Levora Dredge ?Entered By: Levora Dredge on 12/25/2021 13:11:00 ?

## 2021-12-25 NOTE — Progress Notes (Signed)
Martha [name] ([id number]) ?Visit Report for 12/25/2021 ?Allergy List Details ?Patient Name: Martha [name], Martha [name] ?Date of Service: 12/25/2021 12:45 PM ?Medical Record Number: [id number] ?Patient Account Number: [id number] ?Date of Birth/Sex: [birth year] (66 y.o. F) ?Treating RN: Levora Dredge ?Primary Care Albin Duckett: Denton Lank Other Clinician: ?Referring Deunta Beneke: Denton Lank ?Treating Shirl Ludington/Extender: Jeri Cos ?Weeks in Treatment: 0 ?Allergies ?Active Allergies ?sulfamethizole ?Reaction: swollen tongue breathing issues, out of it ?Allergy Notes ?Electronic Signature(s) ?Signed: 12/25/2021 4:57:35 PM By: Levora Dredge ?Entered By: Levora Dredge on 12/25/2021 13:03:51 ?KARIGAN, CLONINGER ([id number]) ?-------------------------------------------------------------------------------- ?Arrival Information Details ?Patient Name: Martha [name] ?Date of Service: 12/25/2021 12:45 PM ?Medical Record Number: [id number] ?Patient Account Number: [id number] ?Date of Birth/Sex: Apr 30, 1956 (66 y.o. F) ?Treating RN: Levora Dredge ?Primary Care Tyrese Ficek: Denton Lank Other Clinician: ?Referring Blayton Huttner: Denton Lank ?Treating Kashauna Celmer/Extender: Jeri Cos ?Weeks in Treatment: 0 ?Visit Information ?Patient Arrived: [name] ?Arrival Time: 12:57 ?Accompanied By: daughter ?Transfer Assistance: None ?Patient Identification Verified: Yes ?Secondary Verification Process Completed: Yes ?History Since Last Visit ?Added or deleted any medications: No ?Any new allergies or adverse reactions: No ?Hospitalized since last visit: No ?Has Dressing in Place as Prescribed: Yes ?Pain Present Now: Yes ?Electronic Signature(s) ?Signed: 12/25/2021 4:57:35 PM By: Levora Dredge ?Entered By: Levora Dredge on 12/25/2021 12:57:59 ?DELISE, SIMENSON ([id number]) ?-------------------------------------------------------------------------------- ?Clinic Level of Care Assessment Details ?Patient Name: Martha [name] ?Date  of Service: 12/25/2021 12:45 PM ?Medical Record Number: [id number] ?Patient Account Number: [id number] ?Date of Birth/Sex: February 08, 1956 (66 y.o. F) ?Treating RN: Levora Dredge ?Primary Care Daanish Copes: Denton Lank Other Clinician: ?Referring Karl Knarr: Denton Lank ?Treating Marleen Moret/Extender: Jeri Cos ?Weeks in Treatment: 0 ?Clinic Level of Care Assessment Items ?TOOL 1 Quantity Score ?'[]'$  - Use when EandM and Procedure is performed on INITIAL visit 0 ?ASSESSMENTS - Nursing Assessment / Reassessment ?X - General Physical Exam (combine w/ comprehensive assessment (listed just below) when performed on new ?1 20 ?pt. evals) ?X- 1 25 ?Comprehensive Assessment (HX, ROS, Risk Assessments, Wounds Hx, etc.) ?ASSESSMENTS - Wound and Skin Assessment / Reassessment ?'[]'$  - Dermatologic / Skin Assessment (not related to wound area) 0 ?ASSESSMENTS - Ostomy and/or Continence Assessment and Care ?'[]'$  - Incontinence Assessment and Management 0 ?'[]'$  - 0 ?Ostomy Care Assessment and Management (repouching, etc.) ?PROCESS - Coordination of Care ?X - Simple Patient / Family Education for ongoing care 1 15 ?'[]'$  - 0 ?Complex (extensive) Patient / Family Education for ongoing care ?X- 1 10 ?Staff obtains Consents, Records, Test Results / Process Orders ?'[]'$  - 0 ?Staff telephones HHA, Nursing Homes / Clarify orders / etc ?'[]'$  - 0 ?Routine Transfer to another Facility (non-emergent condition) ?'[]'$  - 0 ?Routine Hospital Admission (non-emergent condition) ?X- 1 15 ?New Admissions / Biomedical engineer / Ordering NPWT, Apligraf, etc. ?'[]'$  - 0 ?Emergency Hospital Admission (emergent condition) ?PROCESS - Special Needs ?'[]'$  - Pediatric / Minor Patient Management 0 ?'[]'$  - 0 ?Isolation Patient Management ?'[]'$  - 0 ?Hearing / Language / Visual special needs ?'[]'$  - 0 ?Assessment of Community assistance (transportation, D/C planning, etc.) ?'[]'$  - 0 ?Additional assistance / Altered mentation ?'[]'$  - 0 ?Support Surface(s) Assessment (bed, cushion, seat,  etc.) ?INTERVENTIONS - Miscellaneous ?'[]'$  - External ear exam 0 ?'[]'$  - 0 ?Patient Transfer (multiple staff / Civil Service fast streamer / Similar devices) ?'[]'$  - 0 ?Simple Staple / Suture removal (25 or less) ?'[]'$  - 0 ?Complex Staple / Suture removal (26 or more) ?'[]'$  - 0 ?Hypo/Hyperglycemic Management (do not check if billed separately) ?X- 1 15 ?Ankle / Brachial Index (ABI) -  do not check if billed separately ?Has the patient been seen at the hospital within the last three years: Yes ?Total Score: 100 ?Level Of Care: New/Established - Level ?3 ?ANAISHA, MAGO ([id number]) ?Electronic Signature(s) ?Signed: 12/25/2021 4:57:35 PM By: Levora Dredge ?Entered By: Levora Dredge on 12/25/2021 16:46:26 ?RMANI, KAPUSTA ([id number]) ?-------------------------------------------------------------------------------- ?Encounter Discharge Information Details ?Patient Name: Martha [name] ?Date of Service: 12/25/2021 12:45 PM ?Medical Record Number: [id number] ?Patient Account Number: [id number] ?Date of Birth/Sex: 04-12-1956 (66 y.o. F) ?Treating RN: Levora Dredge ?Primary Care Yi Haugan: Denton Lank Other Clinician: ?Referring Natacha Jepsen: Denton Lank ?Treating Nevah Dalal/Extender: Jeri Cos ?Weeks in Treatment: 0 ?Encounter Discharge Information Items Post Procedure Vitals ?Discharge Condition: Stable ?Temperature (?F): 97.6 ?Ambulatory Status: [name] ?Pulse (bpm): 90 ?Discharge Destination: Home ?Respiratory Rate (breaths/min): 18 ?Transportation: Private Auto ?Blood Pressure (mmHg): 161/97 ?Accompanied By: family ?Schedule Follow-up Appointment: Yes ?Clinical Summary of Care: Patient Declined ?Electronic Signature(s) ?Signed: 12/25/2021 4:48:20 PM By: Levora Dredge ?Entered By: Levora Dredge on 12/25/2021 16:48:20 ?KATELAN, HIRT ([id number]) ?-------------------------------------------------------------------------------- ?Lower Extremity Assessment Details ?Patient Name: Martha [name] ?Date of Service:  12/25/2021 12:45 PM ?Medical Record Number: [id number] ?Patient Account Number: [id number] ?Date of Birth/Sex: 06-13-1956 (66 y.o. F) ?Treating RN: Levora Dredge ?Primary Care Derral Colucci: Denton Lank Other Clinician: ?Referring Mardelle Pandolfi: Denton Lank ?Treating Chrisotpher Rivero/Extender: Jeri Cos ?Weeks in Treatment: 0 ?Edema Assessment ?Assessed: [Left: No] [Right: No] ?Edema: [Left: Ye] [Right: s] ?Calf ?Left: Right: ?Point of Measurement: 31 cm From Medial Instep 37 cm ?Ankle ?Left: Right: ?Point of Measurement: 10 cm From Medial Instep 22.6 cm ?Vascular Assessment ?Pulses: ?Dorsalis Pedis ?Palpable: [Left:Yes] ?Posterior Tibial ?Palpable: [Left:Yes] ?Blood Pressure: ?Brachial: [Left:161] ?Dorsalis Pedis: 140 ?Ankle: ?Posterior Tibial: 145 ?Ankle Brachial Index: [Left:0.90] ?Electronic Signature(s) ?Signed: 12/25/2021 4:57:35 PM By: Levora Dredge ?Entered By: Levora Dredge on 12/25/2021 13:23:32 ?ODESSA, NISHI ([id number]) ?-------------------------------------------------------------------------------- ?Multi Wound Chart Details ?Patient Name: KEYONNA, COMUNALE ?Date of Service: 12/25/2021 12:45 PM ?Medical Record Number: [id number] ?Patient Account Number: [id number] ?Date of Birth/Sex: Jun 02, 1956 (66 y.o. 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

## 2022-01-02 DIAGNOSIS — E11622 Type 2 diabetes mellitus with other skin ulcer: Secondary | ICD-10-CM | POA: Diagnosis not present

## 2022-01-03 ENCOUNTER — Encounter
Admission: RE | Admit: 2022-01-03 | Discharge: 2022-01-03 | Disposition: A | Discharge status: Home / Self Care | Payer: Medicare HMO | Source: Ambulatory Visit | Attending: Orthopedic Surgery | Admitting: Orthopedic Surgery

## 2022-01-03 VITALS — BP 126/59 | HR 72 | Resp 16 | Ht 64.0 in | Wt 240.1 lb

## 2022-01-03 DIAGNOSIS — Z01818 Encounter for other preprocedural examination: Secondary | ICD-10-CM

## 2022-01-03 DIAGNOSIS — Z01812 Encounter for preprocedural laboratory examination: Secondary | ICD-10-CM | POA: Insufficient documentation

## 2022-01-03 DIAGNOSIS — E119 Type 2 diabetes mellitus without complications: Secondary | ICD-10-CM | POA: Insufficient documentation

## 2022-01-03 HISTORY — DX: Anemia, unspecified: D64.9

## 2022-01-03 HISTORY — DX: Renal tubulo-interstitial disease, unspecified: N15.9

## 2022-01-03 HISTORY — DX: Unspecified osteoarthritis, unspecified site: M19.90

## 2022-01-03 LAB — CBC WITH DIFFERENTIAL/PLATELET
Abs Immature Granulocytes: 0.03 10*3/uL (ref 0.00–0.07)
Basophils Absolute: 0.1 10*3/uL (ref 0.0–0.1)
Basophils Relative: 1 %
Eosinophils Absolute: 0.2 10*3/uL (ref 0.0–0.5)
Eosinophils Relative: 1 %
HCT: 46 % (ref 36.0–46.0)
Hemoglobin: 15.1 g/dL — ABNORMAL HIGH (ref 12.0–15.0)
Immature Granulocytes: 0 %
Lymphocytes Relative: 33 %
Lymphs Abs: 3.6 10*3/uL (ref 0.7–4.0)
MCH: 28.7 pg (ref 26.0–34.0)
MCHC: 32.8 g/dL (ref 30.0–36.0)
MCV: 87.3 fL (ref 80.0–100.0)
Monocytes Absolute: 0.7 10*3/uL (ref 0.1–1.0)
Monocytes Relative: 6 %
Neutro Abs: 6.4 10*3/uL (ref 1.7–7.7)
Neutrophils Relative %: 59 %
Platelets: 360 10*3/uL (ref 150–400)
RBC: 5.27 MIL/uL — ABNORMAL HIGH (ref 3.87–5.11)
RDW: 14.2 % (ref 11.5–15.5)
WBC: 10.9 10*3/uL — ABNORMAL HIGH (ref 4.0–10.5)
nRBC: 0 % (ref 0.0–0.2)

## 2022-01-03 LAB — COMPREHENSIVE METABOLIC PANEL
ALT: 23 U/L (ref 0–44)
AST: 30 U/L (ref 15–41)
Albumin: 4.2 g/dL (ref 3.5–5.0)
Alkaline Phosphatase: 113 U/L (ref 38–126)
Anion gap: 11 (ref 5–15)
BUN: 18 mg/dL (ref 8–23)
CO2: 24 mmol/L (ref 22–32)
Calcium: 9.4 mg/dL (ref 8.9–10.3)
Chloride: 100 mmol/L (ref 98–111)
Creatinine, Ser: 0.61 mg/dL (ref 0.44–1.00)
GFR, Estimated: >60 mL/min (ref >60)
Glucose, Bld: 145 mg/dL — ABNORMAL HIGH (ref 70–99)
Potassium: 3.9 mmol/L (ref 3.5–5.1)
Sodium: 135 mmol/L (ref 135–145)
Total Bilirubin: 0.3 mg/dL (ref 0.3–1.2)
Total Protein: 7.7 g/dL (ref 6.5–8.1)

## 2022-01-03 LAB — URINALYSIS, ROUTINE W REFLEX MICROSCOPIC
Bacteria, UA: NONE SEEN
Bilirubin Urine: NEGATIVE
Glucose, UA: >=500 mg/dL — AB
Hgb urine dipstick: NEGATIVE
Ketones, ur: NEGATIVE mg/dL
Nitrite: NEGATIVE
Protein, ur: NEGATIVE mg/dL
Specific Gravity, Urine: 1.029 (ref 1.005–1.030)
pH: 5 (ref 5.0–8.0)

## 2022-01-03 LAB — TYPE AND SCREEN
ABO/RH(D): O POS
Antibody Screen: NEGATIVE

## 2022-01-03 LAB — HEMOGLOBIN A1C
Hgb A1c MFr Bld: 8 % — ABNORMAL HIGH (ref 4.8–5.6)
Mean Plasma Glucose: 182.9 mg/dL

## 2022-01-03 LAB — SURGICAL PCR SCREEN
MRSA, PCR: NEGATIVE
Staphylococcus aureus: NEGATIVE

## 2022-01-03 NOTE — Pre-Procedure Instructions (Signed)
In house [place] not available. Interpreter accessed on tablet to complete preop interview. Interpreter # [phone]

## 2022-01-03 NOTE — Patient Instructions (Addendum)
Your procedure is scheduled on:01-17-22 Thursday Report to the Registration Desk on the 1st floor of the [facility].Then proceed to the 2nd floor Surgery Desk  To find out your arrival time, please call [phone] between 1PM - 3PM on:01-16-22 Wednesday If your arrival time is 6:00 am, do not arrive prior to that time as the [facility] entrance doors do not open until 6:00 am.  REMEMBER: Instructions that are not followed completely may result in serious medical risk, up to and including death; or upon the discretion of your surgeon and anesthesiologist your surgery may need to be rescheduled.  Do not eat food after midnight the night before surgery.  No gum chewing, lozengers or hard candies.  You may however, drink Water up to 2 hours before you are scheduled to arrive for your surgery. Do not drink anything within 2 hours of your scheduled arrival time.  Type 1 and Type 2 diabetics should only drink water.  In addition, your doctor has ordered for you to drink the provided  Gatorade G2 Drinking this carbohydrate drink up to two hours before surgery helps to reduce insulin resistance and improve patient outcomes. Please complete drinking 2 hours prior to scheduled arrival time.  Do NOT take any medication the day of surgery  Stop your Jardiance 3 days prior to surgery-Last dose on 01-13-22 Sunday  One week prior to surgery:Last dose on 01-09-22 Wednesday Stop Anti-inflammatories (NSAIDS) such as Advil, Aleve, Ibuprofen, Motrin, Naproxen, Naprosyn and Aspirin based products such as Excedrin, Goodys Powder, BC Powder.You may however, take Tylenol if needed for pain up until the day of surgery.  Stop ANY OVER THE COUNTER supplements/vitamins 7 days prior to surgery  No Alcohol for 24 hours before or after surgery.  No Smoking including e-cigarettes for 24 hours prior to surgery.  No chewable tobacco products for at least 6 hours prior to surgery.  No nicotine patches on the day of  surgery.  Do not use any "recreational" drugs for at least a week prior to your surgery.  Please be advised that the combination of cocaine and anesthesia may have negative outcomes, up to and including death. If you test positive for cocaine, your surgery will be cancelled.  On the morning of surgery brush your teeth with toothpaste and water, you may rinse your mouth with mouthwash if you wish. Do not swallow any toothpaste or mouthwash.  Use CHG Soap as directed on instruction sheet.  Do not wear jewelry, make-up, hairpins, clips or nail polish.  Do not wear lotions, powders, or perfumes.   Do not shave body from the neck down 48 hours prior to surgery just in case you cut yourself which could leave a site for infection.  Also, freshly shaved skin may become irritated if using the CHG soap.  Contact lenses, hearing aids and dentures may not be worn into surgery.  Do not bring valuables to the hospital. [facility] is not responsible for any missing/lost belongings or valuables.   Notify your doctor if there is any change in your medical condition (cold, fever, infection).  Wear comfortable clothing (specific to your surgery type) to the hospital.  After surgery, you can help prevent lung complications by doing breathing exercises.  Take deep breaths and cough every 1-2 hours. Your doctor may order a device called an Incentive Spirometer to help you take deep breaths. When coughing or sneezing, hold a pillow firmly against your incision with both hands. This is called "splinting." Doing this  helps protect your incision. It also decreases belly discomfort.  If you are being admitted to the hospital overnight, leave your suitcase in the car. After surgery it may be brought to your room.  If you are being discharged the day of surgery, you will not be allowed to drive home. You will need a responsible adult (18 years or older) to drive you home and stay with you that night.   If  you are taking public transportation, you will need to have a responsible adult (18 years or older) with you. Please confirm with your physician that it is acceptable to use public transportation.   Please call the [facility] Dept. at [phone] if you have any questions about these instructions.  Surgery Visitation Policy:  Patients undergoing a surgery or procedure may have two family members or support persons with them as long as the person is not COVID-19 positive or experiencing its symptoms.   Inpatient Visitation:    Visiting hours are 7 a.m. to 8 p.m. Up to four visitors are allowed at one time in a patient room, including children. The visitors may rotate out with other people during the day. One designated support person (adult) may remain overnight.      Su procedimiento est programado para:01-17-22 Clinical research associate de Aeronautical engineer del [employer]. Luego dirjase al [employer] de [place] del segundo piso Para averiguar su hora de llegada, llame al [phone] entre la 1:00 p. m. y las 3:00 Vian mircoles 31-5-23 Si su hora de llegada es a las 6:00 a. m., no llegue antes de esa hora ya que las puertas de [place] [facility] no se abren hasta las 6:00 a. m.  RECORDAR: Las instrucciones que no se siguen por Paramedic en un riesgo mdico grave, que puede incluir la [place]; o segn el criterio de su cirujano y Environmental health practitioner, es posible que sea Firefighter su Leisure centre manager.  No coma alimentos despus de la medianoche de la noche anterior a la ciruga. No masticar chicle, pastillas o caramelos duros.  Sin embargo, puede beber [employer] 2 horas antes de la fecha prevista para la [place]. No beba nada dentro de las 2 horas de su hora de llegada programada.  Los diabticos tipo 1 y tipo 2 solo deben Conservation officer, historic buildings.  Adems, su mdico le ha indicado que beba la cantidad proporcionada Gatorade G2 Beber esta  bebida de carbohidratos [employer] horas antes de la ciruga ayuda a reducir la resistencia a la insulina y [employer] de [place]. Complete la bebida 2 horas antes de la hora de llegada programada.  NO tome ningn medicamento el da de la ciruga  Suspenda su Jardiance 3 das antes de la ciruga: ltima dosis el domingo 01-13-22  Ardelia Mems semana antes de la ciruga: ltima dosis el mircoles 24-5-23 Deje de tomar antiinflamatorios (NSAIDS) como Advil, Aleve, Ibuprofen, Motrin, Naproxen, Naprosyn y productos a base de aspirina como Excedrin, Goodys Powder, [employer]. Sin embargo, puede tomar Tylenol si es necesario para Audiological scientist de la [place]. Moises Blood CUALQUIER suplemento/vitamina de venta libre 7 das antes de la ciruga  No alcohol por 24 horas antes o despus de la [place].  No fumar, incluidos los cigarrillos electrnicos, durante las 24 horas previas a la [place]. No productos de tabaco masticables durante al menos 6 horas antes de la [place]. Sin parches de Special educational needs teacher de la [place].  No use ningn medicamento "recreativo" durante al menos una semana antes de su [place]. Tenga en cuenta que la combinacin de cocana y anestesia puede tener resultados negativos, incluso la Soldier. Si la prueba de Education officer, museum positivo, se cancelar la ciruga.  En la maana de la ciruga cepllese los dientes con pasta dental y agua, [place] enjuagarse la boca con enjuague bucal si lo desea. No trague ninguna pasta de dientes o enjuague bucal.  Use CHG Soap como se indica en la hoja de instrucciones.  No use joyas, maquillaje, horquillas para el cabello, clips o esmalte de uas.  No use lociones, talcos ni perfumes.  No se afeite el cuerpo desde el cuello hacia abajo 48 horas antes de la ciruga en caso de que se corte, lo que podra dejar un sitio para la infeccin. Adems, la piel recin afeitada puede irritarse si se [place] el jabn CHG.  No se pueden usar lentes de  contacto, audfonos ni dentaduras postizas en la ciruga.  No lleve objetos de valor al hospital. [facility] no se responsabiliza por pertenencias u objetos de valor extraviados o perdidos.  Informe a su mdico si hay algn cambio en su condicin mdica (resfriado, fiebre, infeccin).  Use ropa cmoda (especfica para su tipo de [place]) al hospital.  Despus de la ciruga, puede ayudar a prevenir complicaciones pulmonares haciendo ejercicios de respiracin. Tome respiraciones profundas y tosa cada 1-2 horas. Su mdico puede ordenar un dispositivo llamado espirmetro de incentivo para ayudarlo a respirar profundamente. Al toser o estornudar, sostenga una almohada firmemente contra la incisin con ambas manos. Esto se llama "entablillado". Hacer esto ayuda a proteger su incisin. Martin molestias abdominales.  Si lo internan en el hospital durante la noche, deje su maleta en el automvil. Despus de la [place], es posible que lo lleven a su habitacin.  Si le dan de alta el da de la [place], no se le permitir conducir hasta su casa. Necesitar un adulto responsable (mayor de 18 aos) que lo lleve a su casa y se quede con usted esa noche.  Si viaja en transporte pblico, deber ir acompaado de un adulto responsable (mayor de 18 aos). Confirme con su mdico que es aceptable usar el transporte pblico.  Llame al Maryln Manuel de [place] al [phone] si tiene alguna pregunta sobre estas instrucciones.  Poltica de visitas a la ciruga:  Los [employer] se someten a [place] o procedimiento pueden [employer] familiares o personas de apoyo con ellos, siempre que la persona no sea COVID-19 positiva o experimente sus sntomas.  Visita de pacientes hospitalizados:  El horario de visita es de 7 a. m. a 8 p. m. Se permiten hasta cuatro visitantes a la vez en la habitacin de un paciente, incluidos los nios. Los visitantes pueden rotar con Merchant navy officer. Ardelia Mems persona de apoyo designada (adulto) puede permanecer durante la noche.

## 2022-01-05 NOTE — Progress Notes (Signed)
Martha [name] ([id number]) Visit Report for 01/02/2022 Arrival Information Details Patient Name: Martha [name] Date of Service: 01/02/2022 3:45 PM Medical Record Number: [id number] Patient Account Number: [id number] Date of Birth/Sex: 1956-04-04 (66 y.o. F) Treating RN: Martha [name] Primary Care Martha [name]: Martha [name] Other Clinician: Referring Martha [name]: Martha [name] Treating Martha [name]: Martha [name] in Treatment: 1 Visit Information History Since Last Visit Added or deleted any medications: No Patient Arrived: Martha [name] Any new allergies or adverse reactions: No Arrival Time: 16:06 Had a fall or experienced change in No Accompanied By: daughter activities of daily living that may affect Transfer Assistance: None risk of falls: Patient Identification Verified: Yes Hospitalized since last visit: No Secondary Verification Process Completed: Yes Has Dressing in Place as Prescribed: Yes Has Compression in Place as Prescribed: Yes Pain Present Now: No Electronic Signature(s) Signed: 01/04/2022 8:14:28 AM By: Martha [name] Entered By: Martha [name] on 01/02/2022 Martha [name] ([id number]) -------------------------------------------------------------------------------- Clinic Level of Care Assessment Details Patient Name: Martha [name] Date of Service: 01/02/2022 3:45 PM Medical Record Number: [id number] Patient Account Number: [id number] Date of Birth/Sex: Mar 18, 1956 (65 y.o. F) Treating RN: Martha [name] Primary Care Omie Ferger: Martha [name] Other Clinician: Referring Dickey [name]: Martha [name] Treating Martha [name]: Martha [name] in Treatment: 1 Clinic Level of Care Assessment Items TOOL 1 Quantity Score '[]'$  - Use when EandM and Procedure is performed on INITIAL visit 0 ASSESSMENTS - Nursing Assessment / Reassessment '[]'$  - General Physical Exam (combine w/ comprehensive assessment (listed just below) when performed on  new 0 pt. evals) '[]'$  - 0 Comprehensive Assessment (HX, ROS, Risk Assessments, Wounds Hx, etc.) ASSESSMENTS - Wound and Skin Assessment / Reassessment '[]'$  - Dermatologic / Skin Assessment (not related to wound area) 0 ASSESSMENTS - Ostomy and/or Continence Assessment and Care '[]'$  - Incontinence Assessment and Management 0 '[]'$  - 0 Ostomy Care Assessment and Management (repouching, etc.) PROCESS - Coordination of Care '[]'$  - Simple Patient / Family Education for ongoing care 0 '[]'$  - 0 Complex (extensive) Patient / Family Education for ongoing care '[]'$  - 0 Staff obtains Programmer, systems, Records, Test Results / Process Orders '[]'$  - 0 Staff telephones HHA, Nursing Homes / Clarify orders / etc '[]'$  - 0 Routine Transfer to another Facility (non-emergent condition) '[]'$  - 0 Routine Hospital Admission (non-emergent condition) '[]'$  - 0 New Admissions / Biomedical engineer / Ordering NPWT, Apligraf, etc. '[]'$  - 0 Emergency Hospital Admission (emergent condition) PROCESS - Special Needs '[]'$  - Pediatric / Minor Patient Management 0 '[]'$  - 0 Isolation Patient Management '[]'$  - 0 Hearing / Language / Visual special needs '[]'$  - 0 Assessment of Community assistance (transportation, D/C planning, etc.) '[]'$  - 0 Additional assistance / Altered mentation '[]'$  - 0 Support Surface(s) Assessment (bed, cushion, seat, etc.) INTERVENTIONS - Miscellaneous '[]'$  - External ear exam 0 '[]'$  - 0 Patient Transfer (multiple staff / Civil Service fast streamer / Similar devices) '[]'$  - 0 Simple Staple / Suture removal (25 or less) '[]'$  - 0 Complex Staple / Suture removal (26 or more) '[]'$  - 0 Hypo/Hyperglycemic Management (do not check if billed separately) '[]'$  - 0 Ankle / Brachial Index (ABI) - do not check if billed separately Has the patient been [name] at the hospital within the last three years: Yes Total Score: 0 Level Of Care: ____ Martha [name] ([id number]) Electronic Signature(s) Signed: 01/04/2022 8:14:28 AM By: Martha [name] Entered  By: Martha [name] on 01/02/2022 16:27:37 Martha [name] ([id number]) -------------------------------------------------------------------------------- Compression Therapy Details Patient Name: Martha [name] Date of Service:  01/02/2022 3:45 PM Medical Record Number: [id number] Patient Account Number: [id number] Date of Birth/Sex: 09-Dec-1955 (65 y.o. F) Treating RN: Martha [name] Primary Care Martha [name]: Martha [name] Other Clinician: Referring Martha [name]: Martha [name] Treating Martha [name]: Martha [name] in Treatment: 1 Compression Therapy Performed for Wound Assessment: Wound #4 Left,Lateral Lower Leg Performed By: Clinician Martha Rossetti, RN Compression Type: Three Layer Pre Treatment ABI: 0.9 Electronic Signature(s) Signed: 01/04/2022 8:14:28 AM By: Martha [name] Entered By: Martha [name] on 01/02/2022 16:26:52 Martha [name] ([id number]) -------------------------------------------------------------------------------- Encounter Discharge Information Details Patient Name: Martha [name] Date of Service: 01/02/2022 3:45 PM Medical Record Number: [id number] Patient Account Number: [id number] Date of Birth/Sex: [birth year] (65 y.o. F) Treating RN: Martha [name] Primary Care Martha [name]: Martha [name] Other Clinician: Referring Martha [name]: Martha [name] Treating Martha [name]: Martha [name] in Treatment: 1 Encounter Discharge Information Items Discharge Condition: Stable Ambulatory Status: Cane Discharge Destination: Home Transportation: Private Auto Accompanied By: daughter Schedule Follow-up Appointment: Yes Clinical Summary of Care: Patient Declined Electronic Signature(s) Signed: 01/04/2022 8:14:28 AM By: Martha [name] Entered By: Martha [name] on 01/02/2022 16:27:32 Martha [name] ([id number]) -------------------------------------------------------------------------------- Wound Assessment Details Patient Name: Martha [name] Date of Service: 01/02/2022 3:45 PM Medical Record Number: [id number] Patient Account Number: [id number] Date of Birth/Sex: 25-Jun-1956 (65 y.o. F) Treating RN: Martha [name] Primary Care Elois Averitt: Martha [name] Other Clinician: Referring Skyah Hannon: Martha [name] Treating Sedrick Tober/Extender: Martha [name] in Treatment: 1 Wound Status Wound Number: 4 Primary Venous Leg Ulcer Etiology: Wound Location: Left, Lateral Lower Leg Wound Status: Open Wounding Event: Gradually Appeared Comorbid Hypertension, Type II Diabetes, Osteoarthritis, Date Acquired: 08/19/2021 History: Neuropathy Weeks Of Treatment: 1 Clustered Wound: No Wound Measurements Length: (cm) 0.9 Width: (cm) 1 Depth: (cm) 0.2 Area: (cm) 0.707 Volume: (cm) 0.141 % Reduction in Area: 0% % Reduction in Volume: 0% Epithelialization: None Tunneling: No Undermining: No Wound Description Classification: Full Thickness Without Exposed Support Structu Exudate Amount: Medium Exudate Type: Serosanguineous Exudate Color: red, brown res Foul Odor After Cleansing: No Slough/Fibrino Yes Wound Bed Granulation Amount: Small (1-33%) Exposed Structure Granulation Quality: Red Fat Layer (Subcutaneous Tissue) Exposed: Yes Necrotic Amount: Large (67-100%) Necrotic Quality: Adherent Slough Treatment Notes Wound #4 (Lower Leg) Wound Laterality: Left, Lateral Cleanser Normal Saline Discharge Instruction: Wash your hands with soap and water. Remove old dressing, discard into plastic bag and place into trash. Cleanse the wound with Normal Saline prior to applying a clean dressing using gauze sponges, not tissues or cotton balls. Do not scrub or use excessive force. Pat dry using gauze sponges, not tissue or cotton balls. Soap and Water Discharge Instruction: Gently cleanse wound with antibacterial soap, rinse and pat dry prior to dressing wounds Peri-Wound Care Topical Primary Dressing Prisma 4.34 (in) Discharge  Instruction: Moisten w/normal saline or sterile water; Cover wound as directed. Do not remove from wound bed. Secondary Dressing (NON-BORDER) Zetuvit Plus Silicone NON-BORDER 5x5 (in/in) Discharge Instruction: Please do not put silicone bordered dressings under wraps. Use non-bordered dressing only under wraps. Secured With Compression Wrap 3-LAYER WRAP - Profore Lite LF 3 Multilayer Compression Bandaging System SATIA, WINGER ([id number]) Discharge Instruction: Apply 3 multi-layer wrap as prescribed. Compression Stockings Add-Ons Electronic Signature(s) Signed: 01/04/2022 8:14:28 AM By: Martha [name] Entered By: Martha [name] on 01/02/2022 16:19:03

## 2022-01-05 NOTE — Progress Notes (Signed)
ARVETTA, ARAQUE ([id number]) Visit Report for 01/02/2022 Physician Orders Details Patient Name: Martha [name], Martha [name] Date of Service: 01/02/2022 3:45 PM Medical Record Number: [id number] Patient Account Number: [id number] Date of Birth/Sex: 1955/11/30 (66 y.o. F) Treating RN: Alycia Rossetti Primary Care Provider: Denton Lank Other Clinician: Referring Provider: Denton Lank Treating Provider/Extender: Yaakov Guthrie in Treatment: 1 Verbal / Phone Orders: No Diagnosis Coding Follow-up Appointments o Return Appointment in 1 week. o Nurse Visit as needed Bathing/ Shower/ Hygiene o May shower with wound dressing protected with water repellent cover or cast protector. o No tub bath. Edema Control - Lymphedema / Segmental Compressive Device / Other o Elevate, Exercise Daily and Avoid Standing for Long Periods of Time. o Elevate leg(s) parallel to the floor when sitting. o DO YOUR BEST to sleep in the bed at night. DO NOT sleep in your recliner. Long hours of sitting in a recliner leads to swelling of the legs and/or potential wounds on your backside. Wound Treatment Wound #4 - Lower Leg Wound Laterality: Left, Lateral Cleanser: Normal Saline 1 x Per Week/30 Days Discharge Instructions: Wash your hands with soap and water. Remove old dressing, discard into plastic bag and place into trash. Cleanse the wound with Normal Saline prior to applying a clean dressing using gauze sponges, not tissues or cotton balls. Do not scrub or use excessive force. Pat dry using gauze sponges, not tissue or cotton balls. Cleanser: Soap and Water 1 x Per Week/30 Days Discharge Instructions: Gently cleanse wound with antibacterial soap, rinse and pat dry prior to dressing wounds Primary Dressing: Prisma 4.34 (in) 1 x Per Week/30 Days Discharge Instructions: Moisten w/normal saline or sterile water; Cover wound as directed. Do not remove from wound bed. Secondary Dressing:  (NON-BORDER) Zetuvit Plus Silicone NON-BORDER 5x5 (in/in) 1 x Per Week/30 Days Discharge Instructions: Please do not put silicone bordered dressings under wraps. Use non-bordered dressing only under wraps. Compression Wrap: 3-LAYER WRAP - Profore Lite LF 3 Multilayer Compression Bandaging System 1 x Per Week/30 Days Discharge Instructions: Apply 3 multi-layer wrap as prescribed. Electronic Signature(s) Signed: 01/02/2022 4:30:23 PM By: Kalman Shan DO Signed: 01/04/2022 8:14:28 AM By: Alycia Rossetti Entered By: Alycia Rossetti on 01/02/2022 16:27:09 Martha [name] ([id number]) -------------------------------------------------------------------------------- SuperBill Details Patient Name: Martha [name] Date of Service: 01/02/2022 Medical Record Number: [id number] Patient Account Number: [id number] Date of Birth/Sex: 1956-05-04 (65 y.o. F) Treating RN: Alycia Rossetti Primary Care Provider: Denton Lank Other Clinician: Referring Provider: Denton Lank Treating Provider/Extender: Yaakov Guthrie in Treatment: 1 Diagnosis Coding ICD-10 Codes Code Description [phone] Type 2 diabetes mellitus with other skin ulcer L97.822 Non-pressure chronic ulcer of other part of left lower leg with fat layer exposed I87.312 Chronic venous hypertension (idiopathic) with ulcer of left lower extremity I10 Essential (primary) hypertension Facility Procedures CPT4 Code: [id number] Description: (Facility Use Only) [id number] - [facility] LEG Modifier: Quantity: 1 Electronic Signature(s) Signed: 01/02/2022 4:30:23 PM By: Kalman Shan DO Signed: 01/04/2022 8:14:28 AM By: Alycia Rossetti Entered By: Alycia Rossetti on 01/02/2022 16:27:46

## 2022-01-08 ENCOUNTER — Encounter: Payer: Medicare HMO | Admitting: Physician Assistant

## 2022-01-08 DIAGNOSIS — E11622 Type 2 diabetes mellitus with other skin ulcer: Secondary | ICD-10-CM | POA: Diagnosis not present

## 2022-01-08 NOTE — Progress Notes (Signed)
KASSADIE, PANCAKE ([id number]) Visit Report for 01/08/2022 Arrival Information Details Patient Name: ZYASIA, HALBLEIB Date of Service: 01/08/2022 3:45 PM Medical Record Number: [id number] Patient Account Number: [id number] Date of Birth/Sex: 1956/05/07 (66 y.o. F) Treating RN: Levora Dredge Primary Care Abdishakur Gottschall: Denton Lank Other Clinician: Referring Domnick Chervenak: Denton Lank Treating Amariss Detamore/Extender: Skipper Cliche in Treatment: 2 Visit Information History Since Last Visit Added or deleted any medications: No Patient Arrived: Martha [name] Any new allergies or adverse reactions: No Arrival Time: 15:48 Had a fall or experienced change in No Accompanied By: daughter activities of daily living that may affect Transfer Assistance: None risk of falls: Patient Identification Verified: Yes Hospitalized since last visit: No Secondary Verification Process Completed: Yes Has Dressing in Place as Prescribed: Yes Has Compression in Place as Prescribed: Yes Pain Present Now: No Electronic Signature(s) Signed: 01/08/2022 4:50:32 PM By: Levora Dredge Entered By: Levora Dredge on 01/08/2022 15:49:25 Martha [name] ([id number]) -------------------------------------------------------------------------------- Clinic Level of Care Assessment Details Patient Name: Martha [name] Date of Service: 01/08/2022 3:45 PM Medical Record Number: [id number] Patient Account Number: [id number] Date of Birth/Sex: Sep 29, 1955 (66 y.o. F) Treating RN: Levora Dredge Primary Care Juliette Standre: Denton Lank Other Clinician: Referring Arta Stump: Denton Lank Treating Kelita Wallis/Extender: Skipper Cliche in Treatment: 2 Clinic Level of Care Assessment Items TOOL 1 Quantity Score '[]'  - Use when EandM and Procedure is performed on INITIAL visit 0 ASSESSMENTS - Nursing Assessment / Reassessment '[]'  - General Physical Exam (combine w/ comprehensive assessment (listed just below) when performed on  new 0 pt. evals) '[]'  - 0 Comprehensive Assessment (HX, ROS, Risk Assessments, Wounds Hx, etc.) ASSESSMENTS - Wound and Skin Assessment / Reassessment '[]'  - Dermatologic / Skin Assessment (not related to wound area) 0 ASSESSMENTS - Ostomy and/or Continence Assessment and Care '[]'  - Incontinence Assessment and Management 0 '[]'  - 0 Ostomy Care Assessment and Management (repouching, etc.) PROCESS - Coordination of Care '[]'  - Simple Patient / Family Education for ongoing care 0 '[]'  - 0 Complex (extensive) Patient / Family Education for ongoing care '[]'  - 0 Staff obtains Programmer, systems, Records, Test Results / Process Orders '[]'  - 0 Staff telephones HHA, Nursing Homes / Clarify orders / etc '[]'  - 0 Routine Transfer to another Facility (non-emergent condition) '[]'  - 0 Routine Hospital Admission (non-emergent condition) '[]'  - 0 New Admissions / Biomedical engineer / Ordering NPWT, Apligraf, etc. '[]'  - 0 Emergency Hospital Admission (emergent condition) PROCESS - Special Needs '[]'  - Pediatric / Minor Patient Management 0 '[]'  - 0 Isolation Patient Management '[]'  - 0 Hearing / Language / Visual special needs '[]'  - 0 Assessment of Community assistance (transportation, D/C planning, etc.) '[]'  - 0 Additional assistance / Altered mentation '[]'  - 0 Support Surface(s) Assessment (bed, cushion, seat, etc.) INTERVENTIONS - Miscellaneous '[]'  - External ear exam 0 '[]'  - 0 Patient Transfer (multiple staff / Civil Service fast streamer / Similar devices) '[]'  - 0 Simple Staple / Suture removal (25 or less) '[]'  - 0 Complex Staple / Suture removal (26 or more) '[]'  - 0 Hypo/Hyperglycemic Management (do not check if billed separately) '[]'  - 0 Ankle / Brachial Index (ABI) - do not check if billed separately Has the patient been [name] at the hospital within the last three years: Yes Total Score: 0 Level Of Care: ____ Sidney Ace, Denton Brick ([id number]) Electronic Signature(s) Signed: 01/08/2022 4:50:32 PM By: Levora Dredge Entered  By: Levora Dredge on 01/08/2022 16:26:12 Martha [name] ([id number]) -------------------------------------------------------------------------------- Encounter Discharge Information Details Patient Name: Martha [name] Date of  Service: 01/08/2022 3:45 PM Medical Record Number: [id number] Patient Account Number: [id number] Date of Birth/Sex: [birth year] (66 y.o. F) Treating RN: Levora Dredge Primary Care Belen Zwahlen: Denton Lank Other Clinician: Referring Alliya Marcon: Denton Lank Treating Caridad Silveira/Extender: Skipper Cliche in Treatment: 2 Encounter Discharge Information Items Post Procedure Vitals Discharge Condition: Stable Temperature (F): 97.6 Ambulatory Status: Cane Pulse (bpm): 85 Discharge Destination: Home Respiratory Rate (breaths/min): 18 Transportation: Private Auto Blood Pressure (mmHg): 155/94 Accompanied By: daughter Schedule Follow-up Appointment: Yes Clinical Summary of Care: Electronic Signature(s) Signed: 01/08/2022 4:50:32 PM By: Levora Dredge Entered By: Levora Dredge on 01/08/2022 16:27:10 Martha [name] ([id number]) -------------------------------------------------------------------------------- Lower Extremity Assessment Details Patient Name: Martha [name] Date of Service: 01/08/2022 3:45 PM Medical Record Number: [id number] Patient Account Number: [id number] Date of Birth/Sex: July 27, 1956 (66 y.o. F) Treating RN: Levora Dredge Primary Care Roxine Whittinghill: Denton Lank Other Clinician: Referring Valina Maes: Denton Lank Treating Zenya Hickam/Extender: Jeri Cos Weeks in Treatment: 2 Edema Assessment Assessed: [Left: No] [Right: No] Edema: [Left: Ye] [Right: s] Calf Left: Right: Point of Measurement: 31 cm From Medial Instep 36.5 cm Ankle Left: Right: Point of Measurement: 10 cm From Medial Instep 21.3 cm Vascular Assessment Pulses: Dorsalis Pedis Palpable: [Left:Yes] Electronic Signature(s) Signed: 01/08/2022 4:50:32  PM By: Levora Dredge Entered By: Levora Dredge on 01/08/2022 16:00:29 Martha [name] ([id number]) -------------------------------------------------------------------------------- Multi Wound Chart Details Patient Name: Martha [name] Date of Service: 01/08/2022 3:45 PM Medical Record Number: [id number] Patient Account Number: [id number] Date of Birth/Sex: 22-Sep-1955 (66 y.o. F) Treating RN: Levora Dredge Primary Care Hameed Kolar: Denton Lank Other Clinician: Referring Neelie Welshans: Denton Lank Treating Kiasha Bellin/Extender: Jeri Cos Weeks in Treatment: 2 Vital Signs Height(in): 64 Pulse(bpm): 85 Weight(lbs): 236 Blood Pressure(mmHg): 155/94 Body Mass Index(BMI): 40.5 Temperature(F): 97.6 Respiratory Rate(breaths/min): 18 Photos: [N/A:N/A] Wound Location: Left, Lateral Lower Leg N/A N/A Wounding Event: Gradually Appeared N/A N/A Primary Etiology: Venous Leg Ulcer N/A N/A Comorbid History: Hypertension, Type II Diabetes, N/A N/A Osteoarthritis, Neuropathy Date Acquired: 08/19/2021 N/A N/A Weeks of Treatment: 2 N/A N/A Wound Status: Open N/A N/A Wound Recurrence: No N/A N/A Measurements L x W x D (cm) 1x0.8x0.2 N/A N/A Area (cm) : 0.628 N/A N/A Volume (cm) : 0.126 N/A N/A % Reduction in Area: 11.20% N/A N/A % Reduction in Volume: 10.60% N/A N/A Classification: Full Thickness Without Exposed N/A N/A Support Structures Exudate Amount: Medium N/A N/A Exudate Type: Serosanguineous N/A N/A Exudate Color: red, brown N/A N/A Granulation Amount: Small (1-33%) N/A N/A Granulation Quality: Red N/A N/A Necrotic Amount: Large (67-100%) N/A N/A Exposed Structures: Fat Layer (Subcutaneous Tissue): N/A N/A Yes Epithelialization: None N/A N/A Treatment Notes Electronic Signature(s) Signed: 01/08/2022 4:50:32 PM By: Levora Dredge Entered By: Levora Dredge on 01/08/2022 16:00:47 Martha [name]  ([id number]) -------------------------------------------------------------------------------- Multi-Disciplinary Care Plan Details Patient Name: Martha [name] Date of Service: 01/08/2022 3:45 PM Medical Record Number: [id number] Patient Account Number: [id number] Date of Birth/Sex: 1956-05-11 (66 y.o. F) Treating RN: Levora Dredge Primary Care Dorothia Passmore: Denton Lank Other Clinician: Referring Amiri Tritch: Denton Lank Treating Datrell Dunton/Extender: Jeri Cos Weeks in Treatment: 2 Active Inactive Pain, Acute or Chronic Nursing Diagnoses: Pain Management - Cyclic Acute (Dressing Change Related) Pain Management - Non-cyclic Acute (Procedural) Pain Management - Non-cyclic Chronic Pain Pain, acute or chronic: actual or potential Potential alteration in comfort, pain Goals: Patient will verbalize adequate pain control and receive pain control interventions during procedures as needed Date Initiated: 12/25/2021 Date Inactivated: 01/08/2022 Target Resolution Date: 01/01/2022 Goal Status: Met Patient/caregiver will verbalize adequate pain control between visits Date  Initiated: 12/25/2021 Target Resolution Date: 01/01/2022 Goal Status: Active Patient/caregiver will verbalize comfort level met Date Initiated: 12/25/2021 Target Resolution Date: 01/01/2022 Goal Status: Active Interventions: Assess comfort goal upon admission Complete pain assessment as per visit requirements Encourage patient to take pain medications as prescribed Implement pain control techniques (non-pharmaceutical) Provide education on pain management Provision of support: recognize patient pain, provide comfort and support as needed Reposition patient for comfort Treatment Activities: Refer to pain specialist or other pain support program : 12/25/2021 Notes: Wound/Skin Impairment Nursing Diagnoses: Impaired tissue integrity Knowledge deficit related to ulceration/compromised skin integrity Goals: Ulcer/skin breakdown  will have a volume reduction of 30% by week 4 Date Initiated: 12/25/2021 Target Resolution Date: 01/22/2022 Goal Status: Active Ulcer/skin breakdown will have a volume reduction of 50% by week 8 Date Initiated: 12/25/2021 Target Resolution Date: 02/19/2022 Goal Status: Active Ulcer/skin breakdown will have a volume reduction of 80% by week 12 Date Initiated: 12/25/2021 Target Resolution Date: 03/19/2022 Goal Status: Active Ulcer/skin breakdown will heal within 14 weeks Date Initiated: 12/25/2021 Target Resolution Date: 04/02/2022 Martha [name], Martha [name] ([id number]) Goal Status: Active Interventions: Assess patient/caregiver ability to obtain necessary supplies Assess patient/caregiver ability to perform ulcer/skin care regimen upon admission and as needed Assess ulceration(s) every visit Provide education on ulcer and skin care Notes: Electronic Signature(s) Signed: 01/08/2022 4:50:32 PM By: Levora Dredge Entered By: Levora Dredge on 01/08/2022 16:00:40 Martha [name] ([id number]) -------------------------------------------------------------------------------- Pain Assessment Details Patient Name: Martha [name] Date of Service: 01/08/2022 3:45 PM Medical Record Number: [id number] Patient Account Number: [id number] Date of Birth/Sex: January 18, 1956 (66 y.o. F) Treating RN: Levora Dredge Primary Care Anden Bartolo: Denton Lank Other Clinician: Referring Kazuki Ingle: Denton Lank Treating Alaja Goldinger/Extender: Jeri Cos Weeks in Treatment: 2 Active Problems Location of Pain Severity and Description of Pain Patient Has Paino No Site Locations Rate the pain. Current Pain Level: 0 Pain Management and Medication Current Pain Management: Electronic Signature(s) Signed: 01/08/2022 4:50:32 PM By: Levora Dredge Entered By: Levora Dredge on 01/08/2022 15:49:48 Martha [name]  ([id number]) -------------------------------------------------------------------------------- Patient/Caregiver Education Details Patient Name: Martha [name] Date of Service: 01/08/2022 3:45 PM Medical Record Number: [id number] Patient Account Number: [id number] Date of Birth/Gender: May 09, 1956 (66 y.o. F) Treating RN: Levora Dredge Primary Care Physician: Denton Lank Other Clinician: Referring Physician: Denton Lank Treating Physician/Extender: Skipper Cliche in Treatment: 2 Education Assessment Education Provided To: Patient Education Topics Provided Wound Debridement: Handouts: Wound Debridement Methods: Explain/Verbal Responses: State content correctly Wound/Skin Impairment: Handouts: Caring for Your Ulcer Methods: Explain/Verbal Responses: State content correctly Electronic Signature(s) Signed: 01/08/2022 4:50:32 PM By: Levora Dredge Entered By: Levora Dredge on 01/08/2022 16:26:32 Martha [name] ([id number]) -------------------------------------------------------------------------------- Wound Assessment Details Patient Name: Martha [name] Date of Service: 01/08/2022 3:45 PM Medical Record Number: [id number] Patient Account Number: [id number] Date of Birth/Sex: 1955-12-12 (66 y.o. F) Treating RN: Levora Dredge Primary Care [place]: Denton Lank Other Clinician: Referring Rush Salce: Denton Lank Treating Shadi Sessler/Extender: Jeri Cos Weeks in Treatment: 2 Wound Status Wound Number: 4 Primary Etiology: Venous Leg Ulcer Wound Location: Left, Lateral Lower Leg Wound Status: Open Wounding Event: Gradually Appeared Comorbid Hypertension, Type II Diabetes, Osteoarthritis, History: Neuropathy Date Acquired: 08/19/2021 Weeks Of Treatment: 2 Clustered Wound: No Photos Wound Measurements Length: (cm) 1 Width: (cm) 0.8 Depth: (cm) 0.2 Area: (cm) 0.628 Volume: (cm) 0.126 % Reduction in Area: 11.2% % Reduction in Volume:  10.6% Epithelialization: None Tunneling: No Undermining: No Wound Description Classification: Full Thickness Without Exposed Support Structures Exudate Amount: Medium Exudate Type: Serosanguineous Exudate Color: red, brown  Foul Odor After Cleansing: No Slough/Fibrino Yes Wound Bed Granulation Amount: Small (1-33%) Exposed Structure Granulation Quality: Red Fat Layer (Subcutaneous Tissue) Exposed: Yes Necrotic Amount: Large (67-100%) Necrotic Quality: Adherent Slough Treatment Notes Wound #4 (Lower Leg) Wound Laterality: Left, Lateral Cleanser Normal Saline Discharge Instruction: Wash your hands with soap and water. Remove old dressing, discard into plastic bag and place into trash. Cleanse the wound with Normal Saline prior to applying a clean dressing using gauze sponges, not tissues or cotton balls. Do not scrub or use excessive force. Pat dry using gauze sponges, not tissue or cotton balls. Soap and Water Discharge Instruction: Gently cleanse wound with antibacterial soap, rinse and pat dry prior to dressing wounds Victoria ([id number]) Topical Primary Dressing Prisma 4.34 (in) Discharge Instruction: Moisten w/normal saline or sterile water; Cover wound as directed. Do not remove from wound bed. Secondary Dressing (NON-BORDER) Zetuvit Plus Silicone NON-BORDER 5x5 (in/in) Discharge Instruction: Please do not put silicone bordered dressings under wraps. Use non-bordered dressing only under wraps. Secured With Compression Wrap 3-LAYER WRAP - Profore Lite LF 3 Multilayer Compression Bandaging System Discharge Instruction: Apply 3 multi-layer wrap as prescribed. Compression Stockings Add-Ons Electronic Signature(s) Signed: 01/08/2022 4:50:32 PM By: Levora Dredge Entered By: Levora Dredge on 01/08/2022 16:00:03 Martha [name] ([id number]) -------------------------------------------------------------------------------- Vitals  Details Patient Name: Martha [name] Date of Service: 01/08/2022 3:45 PM Medical Record Number: [id number] Patient Account Number: [id number] Date of Birth/Sex: [birth year] (66 y.o. F) Treating RN: Levora Dredge Primary Care Carle Dargan: Denton Lank Other Clinician: Referring Fredrick Dray: Denton Lank Treating Raneisha Bress/Extender: Jeri Cos Weeks in Treatment: 2 Vital Signs Time Taken: 15:45 Temperature (F): 97.6 Height (in): 64 Pulse (bpm): 85 Weight (lbs): 236 Respiratory Rate (breaths/min): 18 Body Mass Index (BMI): 40.5 Blood Pressure (mmHg): 155/94 Reference Range: 80 - 120 mg / dl Electronic Signature(s) Signed: 01/08/2022 4:50:32 PM By: Levora Dredge Entered By: Levora Dredge on 01/08/2022 15:49:41

## 2022-01-08 NOTE — Progress Notes (Signed)
Martha [name] ([id number]) Visit Report for 01/08/2022 Chief Complaint Document Details Patient Name: Martha [name], Martha [name] Date of Service: 01/08/2022 3:45 PM Medical Record Number: [id number] Patient Account Number: [id number] Date of Birth/Sex: 12-16-1955 (66 y.o. F) Treating RN: Levora Dredge Primary Care Provider: Denton Lank Other Clinician: Referring Provider: Denton Lank Treating Provider/Extender: Jeri Cos Weeks in Treatment: 2 Information Obtained from: Patient Chief Complaint Left LE Ulcer Electronic Signature(s) Signed: 01/08/2022 3:49:33 PM By: Worthy Keeler PA-C Entered By: Worthy Keeler on 01/08/2022 15:49:33 Martha [name] ([id number]) -------------------------------------------------------------------------------- Debridement Details Patient Name: Martha [name] Date of Service: 01/08/2022 3:45 PM Medical Record Number: [id number] Patient Account Number: [id number] Date of Birth/Sex: [birth year] (66 y.o. F) Treating RN: Levora Dredge Primary Care Provider: Denton Lank Other Clinician: Referring Provider: Denton Lank Treating Provider/Extender: Jeri Cos Weeks in Treatment: 2 Debridement Performed for Wound #4 Left,Lateral Lower Leg Assessment: Performed By: Physician Tommie Sams., PA-C Debridement Type: Debridement Severity of Tissue Pre Debridement: Fat layer exposed Level of Consciousness (Pre- Awake and Alert procedure): Pre-procedure Verification/Time Out Yes - 16:13 Taken: Total Area Debrided (L x W): 1 (cm) x 0.8 (cm) = 0.8 (cm) Tissue and other material Viable, Non-Viable, Slough, Subcutaneous, Biofilm, Slough debrided: Level: Skin/Subcutaneous Tissue Debridement Description: Excisional Instrument: Curette Bleeding: Minimum Hemostasis Achieved: Pressure Response to Treatment: Procedure was tolerated well Level of Consciousness (Post- Awake and Alert procedure): Post Debridement Measurements of Total  Wound Length: (cm) 1 Width: (cm) 0.8 Depth: (cm) 0.2 Volume: (cm) 0.126 Character of Wound/Ulcer Post Debridement: Stable Severity of Tissue Post Debridement: Fat layer exposed Post Procedure Diagnosis Same as Pre-procedure Electronic Signature(s) Signed: 01/08/2022 4:50:32 PM By: Levora Dredge Signed: 01/09/2022 5:52:44 PM By: Worthy Keeler PA-C Entered By: Levora Dredge on 01/08/2022 16:13:35 Martha [name] ([id number]) -------------------------------------------------------------------------------- HPI Details Patient Name: Martha [name] Date of Service: 01/08/2022 3:45 PM Medical Record Number: [id number] Patient Account Number: [id number] Date of Birth/Sex: Jan 20, 1956 (66 y.o. F) Treating RN: Levora Dredge Primary Care Provider: Denton Lank Other Clinician: Referring Provider: Denton Lank Treating Provider/Extender: Jeri Cos Weeks in Treatment: 2 History of Present Illness HPI Description: 12/25/2021 upon evaluation today patient appears to be doing okay in regard to her wound on the lateral aspect of her left leg. This is something that she tells me is somewhat recurrent. She does have a history of lower extremity edema. With that being said she tells me a lot of these areas she just takes care of overall and has done well last time she was [name] here in the office was actually in 2015. She is [name] with her daughter today and with the help of an interpreter we were able to evaluate her effectively today. The patient does tell me that her most recent hemoglobin A1c was 8.0 this was last week however the provider that she sees is not in our system. She also tells me that she has been using Neosporin to dress the wound with an Dial soap to clean although occasionally if it itches she will use alcohol. With that being said I do not see any evidence of infection at this time which is great news she was placed on Bactrim DS and subsequently had what sounds to  be a mild anaphylactic reaction which is not good we have added that to her list of allergies here in the clinic. Otherwise I do believe she is going to be able to effectively get this healed but she is having some issues here with an  upcoming surgery for her right knee to replace that knee and then subsequently she will be doing the left knee as well following. The right knee surgery is scheduled for June 1 3 peripherally honest I am not sure we will have this healed by then I am not sure the surgeon is and want to proceed as long she has an open wound although that will be left up to Dr. Rudene Christians who is her surgeon. The patient does have a history of hypertension, diabetes mellitus type 2, chronic venous insufficiency, and bilateral knee osteoarthritis requiring arthroplasty. 01-08-2022 upon evaluation today patient appears to be doing well with regard to her wound. In fact she is showing signs of improvement which is great news this is measuring smaller. She has talked to her surgeon and they still plan to do her right knee replacement this is again opposite side of her wound and there is no signs of wound infection so I think she is probably okay in that regard. Fortunately there does not appear to be any evidence of active infection locally or systemically at this time which is great news. Electronic Signature(s) Signed: 01/09/2022 8:28:25 AM By: Worthy Keeler PA-C Entered By: Worthy Keeler on 01/09/2022 23:53:61 Martha [name], Martha [name] ([id number]) -------------------------------------------------------------------------------- Physical Exam Details Patient Name: Martha [name] Date of Service: 01/08/2022 3:45 PM Medical Record Number: [id number] Patient Account Number: [id number] Date of Birth/Sex: 1956-06-24 (66 y.o. F) Treating RN: Levora Dredge Primary Care Provider: Denton Lank Other Clinician: Referring Provider: Denton Lank Treating Provider/Extender: Jeri Cos Weeks  in Treatment: 2 Constitutional Obese and well-hydrated in no acute distress. Respiratory normal breathing without difficulty. Psychiatric this patient is able to make decisions and demonstrates good insight into disease process. Alert and Oriented x 3. pleasant and cooperative. Notes Upon inspection patient's wound bed showed evidence of good granulation and epithelization at this point. Fortunately I do not see any evidence of infection and I think she is managing quite nicely headed in the right direction. There was some need for sharp debridement today I did do that lightly and was able to get this looking much better. Electronic Signature(s) Signed: 01/09/2022 8:28:44 AM By: Worthy Keeler PA-C Entered By: Worthy Keeler on 01/09/2022 08:28:44 Martha [name] ([id number]) -------------------------------------------------------------------------------- Physician Orders Details Patient Name: Martha [name] Date of Service: 01/08/2022 3:45 PM Medical Record Number: [id number] Patient Account Number: [id number] Date of Birth/Sex: [birth year] (66 y.o. F) Treating RN: Levora Dredge Primary Care Provider: Denton Lank Other Clinician: Referring Provider: Denton Lank Treating Provider/Extender: Skipper Cliche in Treatment: 2 Verbal / Phone Orders: No Diagnosis Coding ICD-10 Coding Code Description E11.622 Type 2 diabetes mellitus with other skin ulcer L97.822 Non-pressure chronic ulcer of other part of left lower leg with fat layer exposed I87.312 Chronic venous hypertension (idiopathic) with ulcer of left lower extremity I10 Essential (primary) hypertension Follow-up Appointments o Return Appointment in 1 week. o Nurse Visit as needed Bathing/ Shower/ Hygiene o May shower with wound dressing protected with water repellent cover or cast protector. o No tub bath. Edema Control - Lymphedema / Segmental Compressive Device / Other o Elevate, Exercise  Daily and Avoid Standing for Long Periods of Time. o Elevate leg(s) parallel to the floor when sitting. o DO YOUR BEST to sleep in the bed at night. DO NOT sleep in your recliner. Long hours of sitting in a recliner leads to swelling of the legs and/or potential wounds on your backside. Wound Treatment Wound #4 - Lower  Leg Wound Laterality: Left, Lateral Cleanser: Normal Saline 1 x Per Week/30 Days Discharge Instructions: Wash your hands with soap and water. Remove old dressing, discard into plastic bag and place into trash. Cleanse the wound with Normal Saline prior to applying a clean dressing using gauze sponges, not tissues or cotton balls. Do not scrub or use excessive force. Pat dry using gauze sponges, not tissue or cotton balls. Cleanser: Soap and Water 1 x Per Week/30 Days Discharge Instructions: Gently cleanse wound with antibacterial soap, rinse and pat dry prior to dressing wounds Primary Dressing: Prisma 4.34 (in) 1 x Per Week/30 Days Discharge Instructions: Moisten w/normal saline or sterile water; Cover wound as directed. Do not remove from wound bed. Secondary Dressing: (NON-BORDER) Zetuvit Plus Silicone NON-BORDER 5x5 (in/in) 1 x Per Week/30 Days Discharge Instructions: Please do not put silicone bordered dressings under wraps. Use non-bordered dressing only under wraps. Compression Wrap: 3-LAYER WRAP - Profore Lite LF 3 Multilayer Compression Bandaging System 1 x Per Week/30 Days Discharge Instructions: Apply 3 multi-layer wrap as prescribed. Electronic Signature(s) Signed: 01/08/2022 4:50:32 PM By: Levora Dredge Signed: 01/09/2022 5:52:44 PM By: Worthy Keeler PA-C Entered By: Levora Dredge on 01/08/2022 16:26:04 Martha [name] ([id number]) -------------------------------------------------------------------------------- Problem List Details Patient Name: Martha [name] Date of Service: 01/08/2022 3:45 PM Medical Record Number: [id number] Patient  Account Number: [id number] Date of Birth/Sex: [birth year] (66 y.o. F) Treating RN: Levora Dredge Primary Care Provider: Denton Lank Other Clinician: Referring Provider: Denton Lank Treating Provider/Extender: Jeri Cos Weeks in Treatment: 2 Active Problems ICD-10 Encounter Code Description Active Date MDM Diagnosis E11.622 Type 2 diabetes mellitus with other skin ulcer 12/25/2021 No Yes L97.822 Non-pressure chronic ulcer of other part of left lower leg with fat layer 12/25/2021 No Yes exposed I87.312 Chronic venous hypertension (idiopathic) with ulcer of left lower 12/25/2021 No Yes extremity I10 Essential (primary) hypertension 12/25/2021 No Yes Inactive Problems Resolved Problems Electronic Signature(s) Signed: 01/08/2022 3:49:30 PM By: Worthy Keeler PA-C Entered By: Worthy Keeler on 01/08/2022 15:49:29 Martha [name] ([id number]) -------------------------------------------------------------------------------- Progress Note Details Patient Name: Martha [name] Date of Service: 01/08/2022 3:45 PM Medical Record Number: [id number] Patient Account Number: [id number] Date of Birth/Sex: 09/26/1955 (66 y.o. F) Treating RN: Levora Dredge Primary Care Provider: Denton Lank Other Clinician: Referring Provider: Denton Lank Treating Provider/Extender: Skipper Cliche in Treatment: 2 Subjective Chief Complaint Information obtained from Patient Left LE Ulcer History of Present Illness (HPI) 12/25/2021 upon evaluation today patient appears to be doing okay in regard to her wound on the lateral aspect of her left leg. This is something that she tells me is somewhat recurrent. She does have a history of lower extremity edema. With that being said she tells me a lot of these areas she just takes care of overall and has done well last time she was [name] here in the office was actually in 2015. She is [name] with her daughter today and with the help of an interpreter we were  able to evaluate her effectively today. The patient does tell me that her most recent hemoglobin A1c was 8.0 this was last week however the provider that she sees is not in our system. She also tells me that she has been using Neosporin to dress the wound with an Dial soap to clean although occasionally if it itches she will use alcohol. With that being said I do not see any evidence of infection at this time which is great news she was placed on Bactrim  DS and subsequently had what sounds to be a mild anaphylactic reaction which is not good we have added that to her list of allergies here in the clinic. Otherwise I do believe she is going to be able to effectively get this healed but she is having some issues here with an upcoming surgery for her right knee to replace that knee and then subsequently she will be doing the left knee as well following. The right knee surgery is scheduled for June 1 3 peripherally honest I am not sure we will have this healed by then I am not sure the surgeon is and want to proceed as long she has an open wound although that will be left up to Dr. Rudene Christians who is her surgeon. The patient does have a history of hypertension, diabetes mellitus type 2, chronic venous insufficiency, and bilateral knee osteoarthritis requiring arthroplasty. 01-08-2022 upon evaluation today patient appears to be doing well with regard to her wound. In fact she is showing signs of improvement which is great news this is measuring smaller. She has talked to her surgeon and they still plan to do her right knee replacement this is again opposite side of her wound and there is no signs of wound infection so I think she is probably okay in that regard. Fortunately there does not appear to be any evidence of active infection locally or systemically at this time which is great news. Objective Constitutional Obese and well-hydrated in no acute distress. Vitals Time Taken: 3:45 PM, Height: 64 in, Weight:  236 lbs, BMI: 40.5, Temperature: 97.6 F, Pulse: 85 bpm, Respiratory Rate: 18 breaths/min, Blood Pressure: 155/94 mmHg. Respiratory normal breathing without difficulty. Psychiatric this patient is able to make decisions and demonstrates good insight into disease process. Alert and Oriented x 3. pleasant and cooperative. General Notes: Upon inspection patient's wound bed showed evidence of good granulation and epithelization at this point. Fortunately I do not see any evidence of infection and I think she is managing quite nicely headed in the right direction. There was some need for sharp debridement today I did do that lightly and was able to get this looking much better. Integumentary (Hair, Skin) Wound #4 status is Open. Original cause of wound was Gradually Appeared. The date acquired was: 08/19/2021. The wound has been in treatment 2 weeks. The wound is located on the Left,Lateral Lower Leg. The wound measures 1cm length x 0.8cm width x 0.2cm depth; 0.628cm^2 area and 0.126cm^3 volume. There is Fat Layer (Subcutaneous Tissue) exposed. There is no tunneling or undermining noted. There is a medium amount of serosanguineous drainage noted. There is small (1-33%) red granulation within the wound bed. There is a large (67-100%) amount of necrotic tissue within the wound bed including Adherent Slough. 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 There was a Excisional Skin/Subcutaneous Tissue Debridement with a total area of 0.8 sq cm performed by Tommie Sams.,  PA-C. With the following instrument(s): Curette to remove Viable and Non-Viable tissue/material. Material removed includes Subcutaneous Tissue, Slough, and Biofilm. No specimens were taken. A time out was conducted at 16:13, prior to the  start of the procedure. A Minimum amount of bleeding was controlled with Pressure. The procedure was tolerated well. Post Debridement Measurements: 1cm length x 0.8cm width x 0.2cm depth; 0.126cm^3 volume. Character of Wound/Ulcer Post Debridement is stable. Severity of Tissue Post Debridement is: Fat layer exposed. Post procedure Diagnosis Wound #4: Same as Pre-Procedure Plan Follow-up Appointments: Return Appointment in 1 week. Nurse Visit as needed Bathing/ Shower/ Hygiene: May shower with wound dressing protected with water repellent cover or cast protector. No tub bath. Edema Control - Lymphedema / Segmental Compressive Device / Other: Elevate, Exercise Daily and Avoid Standing for Long Periods of Time. Elevate leg(s) parallel to the floor when sitting. DO YOUR BEST to sleep in the bed at night. DO NOT sleep in your recliner. Long hours of sitting in a recliner leads to swelling of the legs and/or potential wounds on your backside. WOUND #4: - Lower Leg Wound Laterality: Left, Lateral Cleanser: Normal Saline 1 x Per Week/30 Days Discharge Instructions: Wash your hands with soap and water. Remove old dressing, discard into plastic bag and place into trash. Cleanse the wound with Normal Saline prior to applying a clean dressing using gauze sponges, not tissues or cotton balls. Do not scrub or use excessive force. Pat dry using gauze sponges, not tissue or cotton balls. Cleanser: Soap and Water 1 x Per Week/30 Days Discharge Instructions: Gently cleanse wound with antibacterial soap, rinse and pat dry prior to dressing wounds Primary Dressing: Prisma 4.34 (in) 1 x Per Week/30 Days Discharge Instructions: Moisten w/normal saline or sterile water; Cover  wound as directed. Do not remove from wound bed. Secondary Dressing: (NON-BORDER) Zetuvit Plus Silicone NON-BORDER 5x5 (in/in) 1 x Per Week/30 Days Discharge Instructions: Please do not put silicone bordered dressings under wraps. Use non-bordered dressing only under wraps. Compression Wrap: 3-LAYER WRAP - Profore Lite LF 3 Multilayer Compression Bandaging System 1 x Per Week/30 Days Discharge Instructions: Apply 3 multi-layer wrap as prescribed. 1. I am going to recommend that we go ahead and continue with the wound care measures as before and the patient is in agreement with plan. This includes the use of the silver collagen dressing which I think is doing well. 2. We will also continue with a 3 layer compression wrap. 3. I would also suggest that the patient continue to monitor for any signs of worsening or infection if anything changes she should let me know but otherwise my hope is this will continue to improve week by week. We will see patient back for reevaluation in 1 week here in the clinic. If anything worsens or changes patient will contact our office for additional recommendations. Martha [name], Martha [name] ([id number]) Electronic Signature(s) Signed: 01/09/2022 8:29:38 AM By: Worthy Keeler PA-C Entered By: Worthy Keeler on 01/09/2022 08:29:38 Martha [name] ([id number]) -------------------------------------------------------------------------------- SuperBill Details Patient Name: Martha [name] Date of Service: 01/08/2022 Medical Record Number: [id number] Patient Account Number: [id number] Date of Birth/Sex: September 25, 1955 (66 y.o. F) Treating RN: Levora Dredge Primary Care Provider: Denton Lank Other Clinician: Referring Provider: Denton Lank Treating Provider/Extender: Jeri Cos Weeks in Treatment: 2 Diagnosis Coding ICD-10 Codes Code Description [phone] Type 2 diabetes mellitus with other skin ulcer L97.822 Non-pressure chronic ulcer of other part of  left lower leg with fat layer exposed I87.312 Chronic venous hypertension (idiopathic) with ulcer of left lower extremity I10 Essential (primary) hypertension Facility Procedures CPT4 Code: 55732202 Description: 54270 - DEB SUBQ TISSUE 20 SQ CM/< Modifier: Quantity: 1 CPT4 Code: Description: ICD-10 Diagnosis Description  O88.757 Non-pressure chronic ulcer of other part of left lower leg with fat layer Modifier: exposed Quantity: Physician Procedures CPT4 Code: 9728206 Description: 01561 - WC PHYS SUBQ TISS 20 SQ CM Modifier: Quantity: 1 CPT4 Code: Description: ICD-10 Diagnosis Description L97.822 Non-pressure chronic ulcer of other part of left lower leg with fat layer Modifier: exposed Quantity: Electronic Signature(s) Signed: 01/09/2022 8:29:47 AM By: Worthy Keeler PA-C Entered By: Worthy Keeler on 01/09/2022 08:29:46

## 2022-01-10 ENCOUNTER — Ambulatory Visit
Admission: RE | Admit: 2022-01-10 | Discharge: 2022-01-10 | Disposition: A | Discharge status: Home / Self Care | Payer: Medicare HMO | Source: Ambulatory Visit | Attending: Family Medicine | Admitting: Family Medicine

## 2022-01-10 DIAGNOSIS — Z1231 Encounter for screening mammogram for malignant neoplasm of breast: Secondary | ICD-10-CM | POA: Diagnosis present

## 2022-01-15 ENCOUNTER — Encounter: Payer: Medicare HMO | Admitting: Physician Assistant

## 2022-01-15 ENCOUNTER — Encounter: Payer: Self-pay | Admitting: Orthopedic Surgery

## 2022-01-15 DIAGNOSIS — E11622 Type 2 diabetes mellitus with other skin ulcer: Secondary | ICD-10-CM | POA: Diagnosis not present

## 2022-01-15 NOTE — Progress Notes (Signed)
Martha [name], Martha [name] ([id number]) Visit Report for 01/15/2022 Arrival Information Details Patient Name: Martha [name], Martha [name] Date of Service: 01/15/2022 3:45 PM Medical Record Number: [id number] Patient Account Number: [id number] Date of Birth/Sex: 19-Aug-1956 (66 y.o. F) Treating RN: Alycia Rossetti Primary Care Walther Sanagustin: Denton Lank Other Clinician: Referring Ariv Penrod: Denton Lank Treating Tavone Caesar/Extender: Skipper Cliche in Treatment: 3 Visit Information History Since Last Visit Added or deleted any medications: No Patient Arrived: Ambulatory Any new allergies or adverse reactions: No Arrival Time: 15:40 Had a fall or experienced change in No Accompanied By: daughter activities of daily living that may affect Transfer Assistance: Other risk of falls: Patient Identification Verified: Yes Hospitalized since last visit: No Secondary Verification Process Completed: Yes Has Dressing in Place as Prescribed: Yes Pain Present Now: No Electronic Signature(s) Unsigned Entered By: Gretta Cool, BSN, RN, CWS, Kim on 01/15/2022 16:14:51 Signature(s): Date(s): CARSEN, MACHI ([id number]) -------------------------------------------------------------------------------- Lower Extremity Assessment Details Patient Name: Martha [name], Martha [name] Date of Service: 01/15/2022 3:45 PM Medical Record Number: [id number] Patient Account Number: [id number] Date of Birth/Sex: 12-Mar-1956 (65 y.o. F) Treating RN: Alycia Rossetti Primary Care Taegan Standage: Denton Lank Other Clinician: Referring Jalyn Dutta: Denton Lank Treating Dayan Desa/Extender: Jeri Cos Weeks in Treatment: 3 Edema Assessment Assessed: [Left: No] [Right: No] [Left: Edema] [Right: :] Calf Left: Right: Point of Measurement: From Medial Instep 36 cm Ankle Left: Right: Point of Measurement: From Medial Instep 20 cm Vascular Assessment Pulses: Dorsalis Pedis Palpable: [Left:Yes] Electronic Signature(s) Unsigned Entered ByAlycia Rossetti on 01/15/2022 16:00:43 Signature(s): Date(s): YONEKO, TALERICO ([id number]) -------------------------------------------------------------------------------- Multi Wound Chart Details Patient Name: Martha [name], Martha [name] Date of Service: 01/15/2022 3:45 PM Medical Record Number: [id number] Patient Account Number: [id number] Date of Birth/Sex: [birth year] (66 y.o. F) Treating RN: Alycia Rossetti Primary Care Gracen Southwell: Denton Lank Other Clinician: Referring Pj Zehner: Denton Lank Treating Kortney Potvin/Extender: Jeri Cos Weeks in Treatment: 3 Vital Signs Height(in): 54 Pulse(bpm): 53 Weight(lbs): 236 Blood Pressure(mmHg): 162/89 Body Mass Index(BMI): 40.5 Temperature(F): 98.2 Respiratory Rate(breaths/min): 16 Photos: [N/A:N/A] Wound Location: Left, Lateral Lower Leg N/A N/A Wounding Event: Gradually Appeared N/A N/A Primary Etiology: Venous Leg Ulcer N/A N/A Comorbid History: Hypertension, Type II Diabetes, N/A N/A Osteoarthritis, Neuropathy Date Acquired: 08/19/2021 N/A N/A Weeks of Treatment: 3 N/A N/A Wound Status: Open N/A N/A Wound Recurrence: No N/A N/A Measurements L x W x D (cm) 1x0.8x0.2 N/A N/A Area (cm) : 0.628 N/A N/A Volume (cm) : 0.126 N/A N/A % Reduction in Area: 11.20% N/A N/A % Reduction in Volume: 10.60% N/A N/A Classification: Full Thickness Without Exposed N/A N/A Support Structures Exudate Amount: Medium N/A N/A Exudate Type: Serosanguineous N/A N/A Exudate Color: red, brown N/A N/A Granulation Amount: Small (1-33%) N/A N/A Granulation Quality: Red N/A N/A Necrotic Amount: Large (67-100%) N/A N/A Exposed Structures: Fat Layer (Subcutaneous Tissue): N/A N/A Yes Epithelialization: None N/A N/A Treatment Notes Electronic Signature(s) Unsigned Entered ByAlycia Rossetti on 01/15/2022 16:03:02 Signature(s): Date(s): CHIMERE, KLINGENSMITH  ([id number]) -------------------------------------------------------------------------------- Multi-Disciplinary Care Plan Details Patient Name: Martha [name], Martha [name] Date of Service: 01/15/2022 3:45 PM Medical Record Number: [id number] Patient Account Number: [id number] Date of Birth/Sex: [birth year] (66 y.o. F) Treating RN: Alycia Rossetti Primary Care Derin Granquist: Denton Lank Other Clinician: Referring Anmol Fleck: Denton Lank Treating Devon Pretty/Extender: Jeri Cos Weeks in Treatment: 3 Active Inactive Pain, Acute or Chronic Nursing Diagnoses: Pain Management - Cyclic Acute (Dressing Change Related) Pain Management - Non-cyclic Acute (Procedural) Pain Management - Non-cyclic Chronic Pain Pain, acute or chronic: actual or potential Potential alteration in comfort, pain Goals: Patient will verbalize  adequate pain control and receive pain control interventions during procedures as needed Date Initiated: 12/25/2021 Date Inactivated: 01/08/2022 Target Resolution Date: 01/01/2022 Goal Status: Met Patient/caregiver will verbalize adequate pain control between visits Date Initiated: 12/25/2021 Target Resolution Date: 01/01/2022 Goal Status: Active Patient/caregiver will verbalize comfort level met Date Initiated: 12/25/2021 Target Resolution Date: 01/01/2022 Goal Status: Active Interventions: Assess comfort goal upon admission Complete pain assessment as per visit requirements Encourage patient to take pain medications as prescribed Implement pain control techniques (non-pharmaceutical) Provide education on pain management Provision of support: recognize patient pain, provide comfort and support as needed Reposition patient for comfort Treatment Activities: Refer to pain specialist or other pain support program : 12/25/2021 Notes: Wound/Skin Impairment Nursing Diagnoses: Impaired tissue integrity Knowledge deficit related to ulceration/compromised skin integrity Goals: Ulcer/skin breakdown  will have a volume reduction of 30% by week 4 Date Initiated: 12/25/2021 Target Resolution Date: 01/22/2022 Goal Status: Active Ulcer/skin breakdown will have a volume reduction of 50% by week 8 Date Initiated: 12/25/2021 Target Resolution Date: 02/19/2022 Goal Status: Active Ulcer/skin breakdown will have a volume reduction of 80% by week 12 Date Initiated: 12/25/2021 Target Resolution Date: 03/19/2022 Goal Status: Active Ulcer/skin breakdown will heal within 14 weeks Date Initiated: 12/25/2021 Target Resolution Date: 04/02/2022 Martha [name], Martha [name] ([id number]) Goal Status: Active Interventions: Assess patient/caregiver ability to obtain necessary supplies Assess patient/caregiver ability to perform ulcer/skin care regimen upon admission and as needed Assess ulceration(s) every visit Provide education on ulcer and skin care Notes: Electronic Signature(s) Unsigned Entered By: Alycia Rossetti on 01/15/2022 16:02:54 Signature(s): Date(s): Martha [name], Martha [name] ([id number]) -------------------------------------------------------------------------------- Pain Assessment Details Patient Name: Martha [name], Martha [name] Date of Service: 01/15/2022 3:45 PM Medical Record Number: [id number] Patient Account Number: [id number] Date of Birth/Sex: 1956/04/22 (66 y.o. F) Treating RN: Alycia Rossetti Primary Care Nolyn Eilert: Denton Lank Other Clinician: Referring Long Brimage: Denton Lank Treating Zacharee Gaddie/Extender: Skipper Cliche in Treatment: 3 Active Problems Location of Pain Severity and Description of Pain Patient Has Paino No Site Locations Pain Management and Medication Current Pain Management: Electronic Signature(s) Unsigned Entered By: Alycia Rossetti on 01/15/2022 15:50:07 Signature(s): Date(s): Martha [name], Martha [name] ([id number]) -------------------------------------------------------------------------------- Wound Assessment Details Patient Name: Martha [name], Martha [name] Date of Service:  01/15/2022 3:45 PM Medical Record Number: [id number] Patient Account Number: [id number] Date of Birth/Sex: November 12, 1955 (66 y.o. F) Treating RN: Alycia Rossetti Primary Care Kimba Lottes: Denton Lank Other Clinician: Referring Damesha Lawler: Denton Lank Treating Lamisha Roussell/Extender: Jeri Cos Weeks in Treatment: 3 Wound Status Wound Number: 4 Primary Etiology: Venous Leg Ulcer Wound Location: Left, Lateral Lower Leg Wound Status: Open Wounding Event: Gradually Appeared Comorbid Hypertension, Type II Diabetes, Osteoarthritis, History: Neuropathy Date Acquired: 08/19/2021 Weeks Of Treatment: 3 Clustered Wound: No Photos Wound Measurements Length: (cm) 1 Width: (cm) 0.8 Depth: (cm) 0.2 Area: (cm) 0.628 Volume: (cm) 0.126 % Reduction in Area: 11.2% % Reduction in Volume: 10.6% Epithelialization: None Wound Description Classification: Full Thickness Without Exposed Support Structures Exudate Amount: Medium Exudate Type: Serosanguineous Exudate Color: red, brown Foul Odor After Cleansing: No Slough/Fibrino Yes Wound Bed Granulation Amount: Small (1-33%) Exposed Structure Granulation Quality: Red Fat Layer (Subcutaneous Tissue) Exposed: Yes Necrotic Amount: Large (67-100%) Necrotic Quality: Adherent Therapist, music) Unsigned Entered ByAlycia Rossetti on 01/15/2022 16:02:42 Signature(s): Date(s): Martha [name], Martha [name] ([id number]) -------------------------------------------------------------------------------- Vitals Details Patient Name: Martha [name], KUNZMAN Date of Service: 01/15/2022 3:45 PM Medical Record Number: [id number] Patient Account Number: [id number] Date of Birth/Sex: [birth year] (66 y.o. F) Treating RN: Alycia Rossetti Primary Care Maryjayne Kleven: Denton Lank Other Clinician: Referring Marisel Tostenson: Denton Lank  Treating Steph Cheadle/Extender: Jeri Cos Weeks in Treatment: 3 Vital Signs Time Taken: 15:50 Temperature (F): 98.2 Height (in): 64 Pulse (bpm):  67 Weight (lbs): 236 Respiratory Rate (breaths/min): 16 Source: Stated Blood Pressure (mmHg): 162/89 Body Mass Index (BMI): 40.5 Reference Range: 80 - 120 mg / dl Electronic Signature(s) Unsigned Entered ByAlycia Rossetti on 01/15/2022 15:50:00 Signature(s): Date(s):

## 2022-01-15 NOTE — Progress Notes (Signed)
Martha [name], Martha [name] ([id number]) Visit Report for 01/15/2022 Chief Complaint Document Details Patient Name: Martha [name], Martha [name] Date of Service: 01/15/2022 3:45 PM Medical Record Number: [id number] Patient Account Number: [id number] Date of Birth/Sex: 1956/02/27 (66 y.o. F) Treating RN: Cornell Barman Primary Care Provider: Denton Lank Other Clinician: Referring Provider: Denton Lank Treating Provider/Extender: Jeri Cos Weeks in Treatment: 3 Information Obtained from: Patient Chief Complaint Left LE Ulcer Electronic Signature(s) Signed: 01/15/2022 3:11:52 PM By: Worthy Keeler PA-C Entered By: Worthy Keeler on 01/15/2022 15:11:52 Martha [name] ([id number]) -------------------------------------------------------------------------------- Problem List Details Patient Name: Martha [name] Date of Service: 01/15/2022 3:45 PM Medical Record Number: [id number] Patient Account Number: [id number] Date of Birth/Sex: Dec 31, 1955 (66 y.o. F) Treating RN: Cornell Barman Primary Care Provider: Denton Lank Other Clinician: Referring Provider: Denton Lank Treating Provider/Extender: Jeri Cos Weeks in Treatment: 3 Active Problems ICD-10 Encounter Code Description Active Date MDM Diagnosis E11.622 Type 2 diabetes mellitus with other skin ulcer 12/25/2021 No Yes L97.822 Non-pressure chronic ulcer of other part of left lower leg with fat layer 12/25/2021 No Yes exposed I87.312 Chronic venous hypertension (idiopathic) with ulcer of left lower 12/25/2021 No Yes extremity I10 Essential (primary) hypertension 12/25/2021 No Yes Inactive Problems Resolved Problems Electronic Signature(s) Signed: 01/15/2022 3:11:49 PM By: Worthy Keeler PA-C Entered By: Worthy Keeler on 01/15/2022 15:11:49

## 2022-01-17 ENCOUNTER — Other Ambulatory Visit: Payer: Self-pay

## 2022-01-17 ENCOUNTER — Observation Stay
Admission: RE | Admit: 2022-01-17 | Discharge: 2022-01-19 | Disposition: A | Discharge status: Home Health | Payer: Medicare HMO | Attending: Orthopedic Surgery | Admitting: Orthopedic Surgery

## 2022-01-17 ENCOUNTER — Encounter: Payer: Self-pay | Admitting: Orthopedic Surgery

## 2022-01-17 ENCOUNTER — Ambulatory Visit: Payer: Medicare HMO | Admitting: Urgent Care

## 2022-01-17 ENCOUNTER — Encounter
Admission: RE | Disposition: A | Discharge status: Home Health | Payer: Self-pay | Source: Home / Self Care | Attending: Orthopedic Surgery

## 2022-01-17 ENCOUNTER — Observation Stay: Payer: Medicare HMO

## 2022-01-17 DIAGNOSIS — Z79899 Other long term (current) drug therapy: Secondary | ICD-10-CM | POA: Diagnosis not present

## 2022-01-17 DIAGNOSIS — Z96651 Presence of right artificial knee joint: Secondary | ICD-10-CM

## 2022-01-17 DIAGNOSIS — E119 Type 2 diabetes mellitus without complications: Secondary | ICD-10-CM | POA: Diagnosis not present

## 2022-01-17 DIAGNOSIS — M1711 Unilateral primary osteoarthritis, right knee: Secondary | ICD-10-CM | POA: Diagnosis not present

## 2022-01-17 DIAGNOSIS — I10 Essential (primary) hypertension: Secondary | ICD-10-CM | POA: Insufficient documentation

## 2022-01-17 DIAGNOSIS — Z8541 Personal history of malignant neoplasm of cervix uteri: Secondary | ICD-10-CM | POA: Insufficient documentation

## 2022-01-17 DIAGNOSIS — Z8616 Personal history of COVID-19: Secondary | ICD-10-CM | POA: Diagnosis not present

## 2022-01-17 DIAGNOSIS — Z7984 Long term (current) use of oral hypoglycemic drugs: Secondary | ICD-10-CM | POA: Insufficient documentation

## 2022-01-17 DIAGNOSIS — E039 Hypothyroidism, unspecified: Secondary | ICD-10-CM | POA: Insufficient documentation

## 2022-01-17 DIAGNOSIS — Z01818 Encounter for other preprocedural examination: Secondary | ICD-10-CM

## 2022-01-17 HISTORY — DX: Type 2 diabetes mellitus without complications: E11.9

## 2022-01-17 HISTORY — PX: TOTAL KNEE ARTHROPLASTY: SHX125

## 2022-01-17 HISTORY — DX: Hyperlipidemia, unspecified: E78.5

## 2022-01-17 HISTORY — DX: Other forms of dyspnea: R06.09

## 2022-01-17 HISTORY — DX: Malignant neoplasm of uterus, part unspecified: C55

## 2022-01-17 LAB — CBC
HCT: 38.6 % (ref 36.0–46.0)
Hemoglobin: 12.5 g/dL (ref 12.0–15.0)
MCH: 28.5 pg (ref 26.0–34.0)
MCHC: 32.4 g/dL (ref 30.0–36.0)
MCV: 88.1 fL (ref 80.0–100.0)
Platelets: 296 10*3/uL (ref 150–400)
RBC: 4.38 MIL/uL (ref 3.87–5.11)
RDW: 14.3 % (ref 11.5–15.5)
WBC: 25.2 10*3/uL — ABNORMAL HIGH (ref 4.0–10.5)
nRBC: 0 % (ref 0.0–0.2)

## 2022-01-17 LAB — ABO/RH: ABO/RH(D): O POS

## 2022-01-17 LAB — CREATININE, SERUM
Creatinine, Ser: 0.52 mg/dL (ref 0.44–1.00)
GFR, Estimated: >60 mL/min (ref >60)

## 2022-01-17 LAB — GLUCOSE, CAPILLARY
Glucose-Capillary: 210 mg/dL — ABNORMAL HIGH (ref 70–99)
Glucose-Capillary: 134 mg/dL — ABNORMAL HIGH (ref 70–99)
Glucose-Capillary: 186 mg/dL — ABNORMAL HIGH (ref 70–99)
Glucose-Capillary: 221 mg/dL — ABNORMAL HIGH (ref 70–99)

## 2022-01-17 SURGERY — ARTHROPLASTY, KNEE, TOTAL
Anesthesia: General | Site: Knee | Laterality: Right

## 2022-01-17 MED ORDER — OXYCODONE HCL 5 MG PO TABS
ORAL_TABLET | ORAL | Status: AC
  Filled 2022-01-17: qty 1

## 2022-01-17 MED ORDER — NEOMYCIN-POLYMYXIN B GU 40-200000 IR SOLN
Status: AC
  Filled 2022-01-17: qty 20

## 2022-01-17 MED ORDER — CEFAZOLIN SODIUM-DEXTROSE 2-4 GM/100ML-% IV SOLN
2.0000 g | INTRAVENOUS | Status: AC
Start: 2022-01-17 — End: 2022-01-17
  Administered 2022-01-17: 2 g via INTRAVENOUS

## 2022-01-17 MED ORDER — MORPHINE SULFATE (PF) 4 MG/ML IV SOLN
INTRAVENOUS | Status: AC
  Filled 2022-01-17: qty 1

## 2022-01-17 MED ORDER — MORPHINE SULFATE (PF) 4 MG/ML IV SOLN
0.5000 mg | INTRAVENOUS | Status: DC | PRN
  Administered 2022-01-17: 1 mg via INTRAVENOUS

## 2022-01-17 MED ORDER — ENOXAPARIN SODIUM 30 MG/0.3ML IJ SOSY
30.0000 mg | PREFILLED_SYRINGE | Freq: Two times a day (BID) | INTRAMUSCULAR | Status: DC
  Administered 2022-01-18 – 2022-01-19 (×3): 30 mg via SUBCUTANEOUS
  Filled 2022-01-17 (×3): qty 0.3

## 2022-01-17 MED ORDER — BUPIVACAINE LIPOSOME 1.3 % IJ SUSP
INTRAMUSCULAR | Status: DC | PRN
  Administered 2022-01-17: 91 mL via INTRAMUSCULAR

## 2022-01-17 MED ORDER — OXYCODONE HCL 5 MG PO TABS
5.0000 mg | ORAL_TABLET | Freq: Once | ORAL | Status: AC | PRN
  Administered 2022-01-17: 5 mg via ORAL

## 2022-01-17 MED ORDER — ONDANSETRON HCL 4 MG PO TABS: 4.0000 mg | ORAL_TABLET | Freq: Four times a day (QID) | ORAL | Status: DC | PRN

## 2022-01-17 MED ORDER — DOCUSATE SODIUM 100 MG PO CAPS
100.0000 mg | ORAL_CAPSULE | Freq: Two times a day (BID) | ORAL | Status: DC
  Administered 2022-01-17 – 2022-01-19 (×4): 100 mg via ORAL
  Filled 2022-01-17 (×4): qty 1

## 2022-01-17 MED ORDER — SODIUM CHLORIDE 0.9 % IR SOLN: Status: DC | PRN

## 2022-01-17 MED ORDER — BUPIVACAINE LIPOSOME 1.3 % IJ SUSP
INTRAMUSCULAR | Status: AC
  Filled 2022-01-17: qty 40

## 2022-01-17 MED ORDER — ONDANSETRON HCL 4 MG/2ML IJ SOLN
4.0000 mg | Freq: Once | INTRAMUSCULAR | Status: DC | PRN
Start: 2022-01-17 — End: 2022-01-17

## 2022-01-17 MED ORDER — FENTANYL CITRATE (PF) 100 MCG/2ML IJ SOLN
INTRAMUSCULAR | Status: AC
  Administered 2022-01-17: 25 ug via INTRAVENOUS
  Filled 2022-01-17: qty 2

## 2022-01-17 MED ORDER — CEFAZOLIN SODIUM-DEXTROSE 2-4 GM/100ML-% IV SOLN
2.0000 g | Freq: Four times a day (QID) | INTRAVENOUS | Status: AC
  Administered 2022-01-17: 2 g via INTRAVENOUS
  Filled 2022-01-17: qty 100

## 2022-01-17 MED ORDER — HYDROCODONE-ACETAMINOPHEN 5-325 MG PO TABS
1.0000 | ORAL_TABLET | ORAL | Status: DC | PRN
  Administered 2022-01-18 – 2022-01-19 (×2): 1 via ORAL
  Filled 2022-01-17 (×2): qty 1

## 2022-01-17 MED ORDER — PROPOFOL 1000 MG/100ML IV EMUL
INTRAVENOUS | Status: AC
  Filled 2022-01-17: qty 100

## 2022-01-17 MED ORDER — METOCLOPRAMIDE HCL 5 MG PO TABS: 5.0000 mg | ORAL_TABLET | Freq: Three times a day (TID) | ORAL | Status: DC | PRN

## 2022-01-17 MED ORDER — CEFAZOLIN SODIUM-DEXTROSE 2-4 GM/100ML-% IV SOLN
INTRAVENOUS | Status: AC
  Filled 2022-01-17: qty 100

## 2022-01-17 MED ORDER — ACETAMINOPHEN 325 MG PO TABS: 325.0000 mg | ORAL_TABLET | Freq: Four times a day (QID) | ORAL | Status: DC | PRN

## 2022-01-17 MED ORDER — LOSARTAN POTASSIUM 50 MG PO TABS
100.0000 mg | ORAL_TABLET | Freq: Every day | ORAL | Status: DC
  Administered 2022-01-17 – 2022-01-18 (×2): 100 mg via ORAL
  Filled 2022-01-17 (×2): qty 2

## 2022-01-17 MED ORDER — LACTATED RINGERS IV SOLN: INTRAVENOUS | Status: DC

## 2022-01-17 MED ORDER — METOCLOPRAMIDE HCL 5 MG/ML IJ SOLN: 5.0000 mg | Freq: Three times a day (TID) | INTRAMUSCULAR | Status: DC | PRN

## 2022-01-17 MED ORDER — HYDROCODONE-ACETAMINOPHEN 7.5-325 MG PO TABS
1.0000 | ORAL_TABLET | ORAL | Status: DC | PRN
  Administered 2022-01-17 – 2022-01-18 (×4): 2 via ORAL
  Filled 2022-01-17 (×4): qty 2

## 2022-01-17 MED ORDER — DIPHENHYDRAMINE HCL 12.5 MG/5ML PO ELIX: 12.5000 mg | ORAL_SOLUTION | ORAL | Status: DC | PRN

## 2022-01-17 MED ORDER — ONDANSETRON HCL 4 MG/2ML IJ SOLN: 4.0000 mg | Freq: Four times a day (QID) | INTRAMUSCULAR | Status: DC | PRN

## 2022-01-17 MED ORDER — MIDAZOLAM HCL 2 MG/2ML IJ SOLN
INTRAMUSCULAR | Status: AC
  Filled 2022-01-17: qty 2

## 2022-01-17 MED ORDER — SURGIPHOR WOUND IRRIGATION SYSTEM - OPTIME: TOPICAL | Status: DC | PRN

## 2022-01-17 MED ORDER — PHENYLEPHRINE 80 MCG/ML (10ML) SYRINGE FOR IV PUSH (FOR BLOOD PRESSURE SUPPORT)
PREFILLED_SYRINGE | INTRAVENOUS | Status: DC | PRN
  Administered 2022-01-17: 160 ug via INTRAVENOUS
  Administered 2022-01-17 (×2): 80 ug via INTRAVENOUS
  Administered 2022-01-17: 160 ug via INTRAVENOUS
  Administered 2022-01-17: 80 ug via INTRAVENOUS
  Administered 2022-01-17: 160 ug via INTRAVENOUS

## 2022-01-17 MED ORDER — METHOCARBAMOL 500 MG PO TABS
500.0000 mg | ORAL_TABLET | Freq: Four times a day (QID) | ORAL | Status: DC | PRN
  Administered 2022-01-17 – 2022-01-19 (×4): 500 mg via ORAL
  Filled 2022-01-17 (×4): qty 1

## 2022-01-17 MED ORDER — PROPOFOL 500 MG/50ML IV EMUL
INTRAVENOUS | Status: DC | PRN
  Administered 2022-01-17: 50 ug/kg/min via INTRAVENOUS

## 2022-01-17 MED ORDER — GABAPENTIN 400 MG PO CAPS
800.0000 mg | ORAL_CAPSULE | Freq: Every day | ORAL | Status: DC
  Administered 2022-01-17 – 2022-01-18 (×2): 800 mg via ORAL
  Filled 2022-01-17 (×2): qty 2

## 2022-01-17 MED ORDER — FENTANYL CITRATE (PF) 100 MCG/2ML IJ SOLN
25.0000 ug | INTRAMUSCULAR | Status: DC | PRN
  Administered 2022-01-17: 25 ug via INTRAVENOUS
  Administered 2022-01-17 (×2): 50 ug via INTRAVENOUS

## 2022-01-17 MED ORDER — SPIRONOLACTONE 25 MG PO TABS
50.0000 mg | ORAL_TABLET | Freq: Every day | ORAL | Status: DC
  Administered 2022-01-18 – 2022-01-19 (×2): 50 mg via ORAL
  Filled 2022-01-17 (×2): qty 2

## 2022-01-17 MED ORDER — METHOCARBAMOL 1000 MG/10ML IJ SOLN
500.0000 mg | Freq: Four times a day (QID) | INTRAVENOUS | Status: DC | PRN
  Administered 2022-01-17: 500 mg via INTRAVENOUS
  Filled 2022-01-17: qty 500

## 2022-01-17 MED ORDER — INSULIN ASPART 100 UNIT/ML IJ SOLN
0.0000 [IU] | Freq: Three times a day (TID) | INTRAMUSCULAR | Status: DC
  Administered 2022-01-17: 3 [IU] via SUBCUTANEOUS
  Administered 2022-01-18 (×3): 5 [IU] via SUBCUTANEOUS
  Administered 2022-01-19: 3 [IU] via SUBCUTANEOUS
  Filled 2022-01-17 (×5): qty 1

## 2022-01-17 MED ORDER — EMPAGLIFLOZIN 25 MG PO TABS
25.0000 mg | ORAL_TABLET | Freq: Every day | ORAL | Status: DC
  Administered 2022-01-18: 25 mg via ORAL
  Filled 2022-01-17 (×2): qty 1

## 2022-01-17 MED ORDER — SENNOSIDES-DOCUSATE SODIUM 8.6-50 MG PO TABS: 1.0000 | ORAL_TABLET | Freq: Every evening | ORAL | Status: DC | PRN

## 2022-01-17 MED ORDER — PROPOFOL 10 MG/ML IV BOLUS
INTRAVENOUS | Status: DC | PRN
  Administered 2022-01-17: 30 mg via INTRAVENOUS

## 2022-01-17 MED ORDER — PHENOL 1.4 % MT LIQD: 1.0000 | OROMUCOSAL | Status: DC | PRN

## 2022-01-17 MED ORDER — FAMOTIDINE 20 MG PO TABS
ORAL_TABLET | ORAL | Status: AC
  Administered 2022-01-17: 20 mg via ORAL
  Filled 2022-01-17: qty 1

## 2022-01-17 MED ORDER — SODIUM CHLORIDE 0.9 % IV SOLN: INTRAVENOUS | Status: DC

## 2022-01-17 MED ORDER — BUPIVACAINE HCL (PF) 0.5 % IJ SOLN
INTRAMUSCULAR | Status: DC | PRN
  Administered 2022-01-17: 2.6 mL via INTRATHECAL

## 2022-01-17 MED ORDER — BUPIVACAINE HCL (PF) 0.5 % IJ SOLN
INTRAMUSCULAR | Status: AC
  Filled 2022-01-17: qty 10

## 2022-01-17 MED ORDER — CHLORHEXIDINE GLUCONATE 0.12 % MT SOLN: 15.0000 mL | Freq: Once | OROMUCOSAL | Status: AC

## 2022-01-17 MED ORDER — FLEET ENEMA 7-19 GM/118ML RE ENEM: 1.0000 | ENEMA | Freq: Once | RECTAL | Status: DC | PRN

## 2022-01-17 MED ORDER — PHENYLEPHRINE HCL-NACL 20-0.9 MG/250ML-% IV SOLN
INTRAVENOUS | Status: DC | PRN
  Administered 2022-01-17: 30 ug/min via INTRAVENOUS

## 2022-01-17 MED ORDER — MENTHOL 3 MG MT LOZG
1.0000 | LOZENGE | OROMUCOSAL | Status: DC | PRN
Start: 2022-01-17 — End: 2022-01-19

## 2022-01-17 MED ORDER — FENTANYL CITRATE (PF) 100 MCG/2ML IJ SOLN
INTRAMUSCULAR | Status: AC
  Filled 2022-01-17: qty 2

## 2022-01-17 MED ORDER — BISACODYL 5 MG PO TBEC: 5.0000 mg | DELAYED_RELEASE_TABLET | Freq: Every day | ORAL | Status: DC | PRN

## 2022-01-17 MED ORDER — FAMOTIDINE 20 MG PO TABS: 20.0000 mg | ORAL_TABLET | Freq: Once | ORAL | Status: AC

## 2022-01-17 MED ORDER — CEFAZOLIN SODIUM-DEXTROSE 2-4 GM/100ML-% IV SOLN
INTRAVENOUS | Status: AC
  Administered 2022-01-17: 2 g via INTRAVENOUS
  Filled 2022-01-17: qty 100

## 2022-01-17 MED ORDER — SODIUM CHLORIDE FLUSH 0.9 % IV SOLN
INTRAVENOUS | Status: AC
  Filled 2022-01-17: qty 70

## 2022-01-17 MED ORDER — MORPHINE SULFATE (PF) 10 MG/ML IV SOLN
INTRAVENOUS | Status: AC
  Filled 2022-01-17: qty 1

## 2022-01-17 MED ORDER — ALUM & MAG HYDROXIDE-SIMETH 200-200-20 MG/5ML PO SUSP: 30.0000 mL | ORAL | Status: DC | PRN

## 2022-01-17 MED ORDER — PANTOPRAZOLE SODIUM 40 MG PO TBEC
40.0000 mg | DELAYED_RELEASE_TABLET | Freq: Every day | ORAL | Status: DC
  Administered 2022-01-18 – 2022-01-19 (×2): 40 mg via ORAL
  Filled 2022-01-17 (×2): qty 1

## 2022-01-17 MED ORDER — ACETAMINOPHEN 10 MG/ML IV SOLN: 1000.0000 mg | Freq: Once | INTRAVENOUS | Status: DC | PRN

## 2022-01-17 MED ORDER — ORAL CARE MOUTH RINSE: 15.0000 mL | Freq: Once | OROMUCOSAL | Status: AC

## 2022-01-17 MED ORDER — ZOLPIDEM TARTRATE 5 MG PO TABS
5.0000 mg | ORAL_TABLET | Freq: Every evening | ORAL | Status: DC | PRN
  Administered 2022-01-17 – 2022-01-18 (×2): 5 mg via ORAL
  Filled 2022-01-17 (×2): qty 1

## 2022-01-17 MED ORDER — MIDAZOLAM HCL 5 MG/5ML IJ SOLN
INTRAMUSCULAR | Status: DC | PRN
  Administered 2022-01-17 (×2): 1 mg via INTRAVENOUS

## 2022-01-17 MED ORDER — LOSARTAN POTASSIUM 50 MG PO TABS: 100.0000 mg | ORAL_TABLET | Freq: Every day | ORAL | Status: DC

## 2022-01-17 MED ORDER — INSULIN ASPART 100 UNIT/ML IJ SOLN
INTRAMUSCULAR | Status: AC
  Filled 2022-01-17: qty 1

## 2022-01-17 MED ORDER — CHLORHEXIDINE GLUCONATE 0.12 % MT SOLN
OROMUCOSAL | Status: AC
  Administered 2022-01-17: 15 mL via OROMUCOSAL
  Filled 2022-01-17: qty 15

## 2022-01-17 MED ORDER — OXYCODONE HCL 5 MG/5ML PO SOLN: 5.0000 mg | Freq: Once | ORAL | Status: AC | PRN

## 2022-01-17 MED ORDER — ACETAMINOPHEN 10 MG/ML IV SOLN
INTRAVENOUS | Status: AC
  Filled 2022-01-17: qty 100

## 2022-01-17 MED ORDER — DULAGLUTIDE 3 MG/0.5ML ~~LOC~~ SOAJ: 3.0000 mg | SUBCUTANEOUS | Status: DC

## 2022-01-17 MED ORDER — BUPIVACAINE-EPINEPHRINE (PF) 0.25% -1:200000 IJ SOLN
INTRAMUSCULAR | Status: AC
  Filled 2022-01-17: qty 60

## 2022-01-17 MED ORDER — ACETAMINOPHEN 10 MG/ML IV SOLN
INTRAVENOUS | Status: DC | PRN
  Administered 2022-01-17: 1000 mg via INTRAVENOUS

## 2022-01-17 MED ORDER — INSULIN ASPART 100 UNIT/ML IJ SOLN
5.0000 [IU] | Freq: Once | INTRAMUSCULAR | Status: AC
  Administered 2022-01-17: 5 [IU] via SUBCUTANEOUS

## 2022-01-17 MED ORDER — TRAMADOL HCL 50 MG PO TABS
50.0000 mg | ORAL_TABLET | Freq: Four times a day (QID) | ORAL | Status: DC
  Administered 2022-01-17 – 2022-01-19 (×7): 50 mg via ORAL
  Filled 2022-01-17 (×7): qty 1

## 2022-01-17 SURGICAL SUPPLY — 67 items
ADAPTER OFFSET 3 (Connector) ×1 IMPLANT
BLADE SAGITTAL 25.0X1.19X90 (BLADE) ×2 IMPLANT
BLADE SAW 90X13X1.19 OSCILLAT (BLADE) ×2 IMPLANT
BNDG ELASTIC 6X5.8 VLCR STR LF (GAUZE/BANDAGES/DRESSINGS) ×2 IMPLANT
CANISTER WOUND CARE 500ML ATS (WOUND CARE) ×2 IMPLANT
CEMENT HV SMART SET (Cement) ×4 IMPLANT
CHLORAPREP W/TINT 26 (MISCELLANEOUS) ×4 IMPLANT
COOLER POLAR GLACIER W/PUMP (MISCELLANEOUS) ×2 IMPLANT
CUFF TOURN SGL QUICK 24 (TOURNIQUET CUFF)
CUFF TOURN SGL QUICK 34 (TOURNIQUET CUFF)
CUFF TRNQT CYL 24X4X16.5-23 (TOURNIQUET CUFF) IMPLANT
CUFF TRNQT CYL 34X4.125X (TOURNIQUET CUFF) IMPLANT
DRAPE 3/4 80X56 (DRAPES) ×4 IMPLANT
DRSG MEPILEX SACRM 8.7X9.8 (GAUZE/BANDAGES/DRESSINGS) ×2 IMPLANT
ELECT CAUTERY BLADE 6.4 (BLADE) ×2 IMPLANT
ELECT REM PT RETURN 9FT ADLT (ELECTROSURGICAL) ×2
ELECTRODE REM PT RTRN 9FT ADLT (ELECTROSURGICAL) ×1 IMPLANT
FEM COMP RT PS 4 (Miscellaneous) ×1 IMPLANT
GAUZE XEROFORM 1X8 LF (GAUZE/BANDAGES/DRESSINGS) ×1 IMPLANT
GLOVE BIOGEL PI IND STRL 9 (GLOVE) ×1 IMPLANT
GLOVE BIOGEL PI INDICATOR 9 (GLOVE) ×1
GLOVE SURG ORTHO 8.0 STRL STRW (GLOVE) ×2 IMPLANT
GLOVE SURG SYN 9.0  PF PI (GLOVE) ×1
GLOVE SURG SYN 9.0 PF PI (GLOVE) ×1 IMPLANT
GLOVE SURG UNDER LTX SZ8 (GLOVE) ×2 IMPLANT
GOWN SRG 2XL LVL 4 RGLN SLV (GOWNS) ×1 IMPLANT
GOWN STRL NON-REIN 2XL LVL4 (GOWNS) ×1
GOWN STRL REUS W/ TWL LRG LVL3 (GOWN DISPOSABLE) ×1 IMPLANT
GOWN STRL REUS W/ TWL XL LVL3 (GOWN DISPOSABLE) ×1 IMPLANT
GOWN STRL REUS W/TWL LRG LVL3 (GOWN DISPOSABLE) ×1
GOWN STRL REUS W/TWL XL LVL3 (GOWN DISPOSABLE) ×1
HOLDER FOLEY CATH W/STRAP (MISCELLANEOUS) ×2 IMPLANT
HOOD PEEL AWAY FLYTE STAYCOOL (MISCELLANEOUS) ×4 IMPLANT
INSERT TIBIAL FIXED 12 ~~LOC~~ SZ3 (Insert) ×1 IMPLANT
IV NS IRRIG 3000ML ARTHROMATIC (IV SOLUTION) ×2 IMPLANT
KIT PREVENA INCISION MGT20CM45 (CANNISTER) ×2 IMPLANT
KIT TURNOVER KIT A (KITS) ×2 IMPLANT
MANIFOLD NEPTUNE II (INSTRUMENTS) ×4 IMPLANT
NDL SAFETY ECLIPSE 18X1.5 (NEEDLE) ×1 IMPLANT
NDL SPNL 20GX3.5 QUINCKE YW (NEEDLE) ×1 IMPLANT
NEEDLE HYPO 18GX1.5 SHARP (NEEDLE) ×1
NEEDLE SPNL 20GX3.5 QUINCKE YW (NEEDLE) ×2 IMPLANT
NS IRRIG 1000ML POUR BTL (IV SOLUTION) ×2 IMPLANT
PACK TOTAL KNEE (MISCELLANEOUS) ×2 IMPLANT
PAD WRAPON POLAR KNEE (MISCELLANEOUS) ×1 IMPLANT
PATELLA RESURFACING MEDACTA 02 (Bone Implant) ×1 IMPLANT
PULSAVAC PLUS IRRIG FAN TIP (DISPOSABLE) ×2
SCALPEL PROTECTED #10 DISP (BLADE) ×4 IMPLANT
SOLUTION IRRIG SURGIPHOR (IV SOLUTION) ×2 IMPLANT
STAPLER SKIN PROX 35W (STAPLE) ×2 IMPLANT
STEM EXTENSION FLUTED 15X65 (Stem) ×1 IMPLANT
STEM EXTENSION FLUTED 18X65 (Stem) ×1 IMPLANT
SUCTION FRAZIER HANDLE 10FR (MISCELLANEOUS) ×1
SUCTION TUBE FRAZIER 10FR DISP (MISCELLANEOUS) ×1 IMPLANT
SUT DVC 2 QUILL PDO  T11 36X36 (SUTURE) ×1
SUT DVC 2 QUILL PDO T11 36X36 (SUTURE) ×1 IMPLANT
SUT ETHIBOND 2 V 37 (SUTURE) ×1 IMPLANT
SUT V-LOC 90 ABS DVC 3-0 CL (SUTURE) ×2 IMPLANT
SYR 20ML LL LF (SYRINGE) ×2 IMPLANT
SYR 50ML LL SCALE MARK (SYRINGE) ×4 IMPLANT
TIP FAN IRRIG PULSAVAC PLUS (DISPOSABLE) ×1 IMPLANT
TOWEL OR 17X26 4PK STRL BLUE (TOWEL DISPOSABLE) ×1 IMPLANT
TOWER CARTRIDGE SMART MIX (DISPOSABLE) ×2 IMPLANT
TRAY FOLEY MTR SLVR 16FR STAT (SET/KITS/TRAYS/PACK) ×2 IMPLANT
TRAY TIB FX RT SZ 3 (Joint) ×1 IMPLANT
WATER STERILE IRR 1000ML POUR (IV SOLUTION) ×3 IMPLANT
WRAPON POLAR PAD KNEE (MISCELLANEOUS) ×2

## 2022-01-17 NOTE — Anesthesia Preprocedure Evaluation (Addendum)
Anesthesia Evaluation  Patient identified by MRN, date of birth, ID band Patient awake    Reviewed: Allergy & Precautions, NPO status , Patient's Chart, lab work & pertinent test results  History of Anesthesia Complications Negative for: history of anesthetic complications  Airway Mallampati: II   Neck ROM: Full    Dental  (+) Missing   Pulmonary neg pulmonary ROS,    Pulmonary exam normal breath sounds clear to auscultation       Cardiovascular hypertension, Normal cardiovascular exam Rhythm:Regular Rate:Normal  ECG 12/27/21:  Sinus rhythm with marked sinus arrhythmia  Minimal voltage criteria for LVH, may be normal variant  Anterior infarct , age undetermined  Myocardial perfusion 12/29/19: Normal myocardial perfusion scan no evidence of stress-induced  medical ischemia ejection fraction equal 54% conclusion negative scan.  Low risk scan.  Echo 12/29/19:  NORMAL LEFT VENTRICULAR SYSTOLIC FUNCTION  WITH MILD LVH  NORMAL RIGHT VENTRICULAR SYSTOLIC FUNCTION  MILD VALVULAR REGURGITATION NO VALVULAR STENOSIS  MILD MR, TR  EF 55%    Neuro/Psych negative neurological ROS     GI/Hepatic negative GI ROS,   Endo/Other  diabetes, Type 2Class 3 obesity  Renal/GU negative Renal ROS     Musculoskeletal  (+) Arthritis ,   Abdominal   Peds  Hematology  (+) Blood dyscrasia, anemia ,   Anesthesia Other Findings Cardiology note 12/27/21:  Plan   -Proceed to surgery and/or invasive procedure without restriction to pre or post operative and/or procedural care. The patient is at lowest risk possible for cardiovascular complications with surgical intervention and/or invasive procedure. Currently has no evidence active and/or significant angina and/or congestive heart failure. -Continue current medical regimen for hypertension control which is stable at this time and without apparent significant side effects or symptoms of  medications. Further treatment goals of low sodium diet for additive effects of these medications have been discussed today as well. -No further cardiovascular intervention and/or medication additions due to only minimal abnormality of cholesterol levels and low 10 year cardiovascular risk score. The patient will continue healthy lifestyle measures to achieve appropriate cardiovascular risk reduction. -We have had a long discussion about the benefits of physical and occupational rehabilitation. The patient is advised and encouraged to enroll for improvements in quality of life and reduced hospitalization. -Continue aggressive medical management of diabetes following the ABCs for prevention of cardiovascular disease and complications. The goals set forth include a goal HbA1c of less than 7, moderate to high intensity statin use if patient can tolerate, and a goal systolic blood pressure of below 171m.  -We have been able to council the patient on the concerns of increased morbidity and mortality of obesity and the steps that can be taken to reduce risk. This includes a lifestyle program, an exercise program, a diet program, and medical management of current cardiovascular risk factors.   Reproductive/Obstetrics Uterine CA                            Anesthesia Physical Anesthesia Plan  ASA: 3  Anesthesia Plan: General and Spinal   Post-op Pain Management:    Induction: Intravenous  PONV Risk Score and Plan: 3 and Propofol infusion, TIVA, Treatment may vary due to age or medical condition and Ondansetron  Airway Management Planned: Natural Airway and Nasal Cannula  Additional Equipment:   Intra-op Plan:   Post-operative Plan:   Informed Consent: I have reviewed the patients History and Physical, chart, labs and discussed the  procedure including the risks, benefits and alternatives for the proposed anesthesia with the patient or authorized representative who has  indicated his/her understanding and acceptance.       Plan Discussed with: CRNA  Anesthesia Plan Comments: (Plan for spinal and GA with natural airway, LMA/GETA backup.  Patient consented for risks of anesthesia including but not limited to:  - adverse reactions to medications - damage to eyes, teeth, lips or other oral mucosa - nerve damage due to positioning  - sore throat or hoarseness - headache, bleeding, infection, nerve damage 2/2 spinal - damage to heart, brain, nerves, lungs, other parts of body or loss of life  Informed patient about role of CRNA in peri- and intra-operative care.  Patient voiced understanding.)        Anesthesia Quick Evaluation

## 2022-01-17 NOTE — Evaluation (Signed)
Physical Therapy Evaluation Patient Details Name: Martha [name] MRN: [id number] DOB: February 07, 1956 Today's Date: 01/17/2022  History of Present Illness  Patient is a 66 year old female s/p R TKA.  Clinical Impression  Patient received in bed in PACU, moaning in pain during session. Patient required min A for supine >< sit. Min/mod A for sit to stand and she was able to take a few steps near bed. Patient pain limited during mobility but had just been given morphine prior to session. Patient will continue to benefit from skilled PT while here to improve functional mobility, independence and safety.          Recommendations for follow up therapy are one component of a multi-disciplinary discharge planning process, led by the attending physician.  Recommendations may be updated based on patient status, additional functional criteria and insurance authorization.  Follow Up Recommendations Home health PT    Assistance Recommended at Discharge Frequent or constant Supervision/Assistance  Patient can return home with the following  A lot of help with bathing/dressing/bathroom;A little help with walking and/or transfers;Assist for transportation;Help with stairs or ramp for entrance    Equipment Recommendations Rolling walker (2 wheels)  Recommendations for Other Services       Functional Status Assessment Patient has had a recent decline in their functional status and demonstrates the ability to make significant improvements in function in a reasonable and predictable amount of time.     Precautions / Restrictions Precautions Precautions: Fall;Knee Restrictions Weight Bearing Restrictions: Yes RLE Weight Bearing: Weight bearing as tolerated      Mobility  Bed Mobility Overal bed mobility: Needs Assistance Bed Mobility: Supine to Sit, Sit to Supine     Supine to sit: Mod assist, HOB elevated Sit to supine: Min assist        Transfers Overall transfer level: Needs  assistance Equipment used: Rolling walker (2 wheels) Transfers: Sit to/from Stand Sit to Stand: Min assist           General transfer comment: patient requires cues/gestures for hand placement on bed to stand and for placement of walker.    Ambulation/Gait Ambulation/Gait assistance: Min assist Gait Distance (Feet): 5 Feet Assistive device: Rolling walker (2 wheels) Gait Pattern/deviations: Step-to pattern, Decreased step length - right, Decreased step length - left, Decreased stride length Gait velocity: decr     General Gait Details: patient required assistance for placement and safe use of RW. Ambulation limited due to pain.  Stairs            Wheelchair Mobility    Modified Rankin (Stroke Patients Only)       Balance Overall balance assessment: Needs assistance Sitting-balance support: Feet supported Sitting balance-Leahy Scale: Good     Standing balance support: Bilateral upper extremity supported, During functional activity, Reliant on assistive device for balance Standing balance-Leahy Scale: Fair Standing balance comment: requires assistance and cues for safety                             Pertinent Vitals/Pain Pain Assessment Pain Assessment: Faces Faces Pain Scale: Hurts whole lot Pain Location: R knee Pain Descriptors / Indicators: Discomfort, Grimacing, Guarding, Moaning Pain Intervention(s): Monitored during session, Repositioned, Premedicated before session    Home Living Family/patient expects to be discharged to:: Private residence Living Arrangements: Children Available Help at Discharge: Family;Available 24 hours/day Type of Home: House Home Access: Stairs to enter Entrance Stairs-Rails: Right;Left Entrance Stairs-Number of Steps: 3-4  Home Layout: One level Home Equipment: Conservation officer, nature (2 wheels);Wheelchair - manual      Prior Function Prior Level of Function : Independent/Modified Independent              Mobility Comments: per daughter was independent prior to surgery ADLs Comments: independent     Hand Dominance        Extremity/Trunk Assessment   Upper Extremity Assessment Upper Extremity Assessment: Overall WFL for tasks assessed    Lower Extremity Assessment Lower Extremity Assessment: RLE deficits/detail RLE: Unable to fully assess due to pain RLE Coordination: decreased gross motor    Cervical / Trunk Assessment Cervical / Trunk Assessment: Normal  Communication   Communication: Prefers language other than English  Cognition Arousal/Alertness: Awake/alert Behavior During Therapy: WFL for tasks assessed/performed Overall Cognitive Status: Difficult to assess                                          General Comments      Exercises Total Joint Exercises Goniometric ROM: 0-80 grossly   Assessment/Plan    PT Assessment Patient needs continued PT services  PT Problem List Decreased strength;Decreased mobility;Decreased safety awareness;Decreased range of motion;Decreased activity tolerance;Decreased balance;Pain;Decreased knowledge of use of DME       PT Treatment Interventions DME instruction;Therapeutic exercise;Gait training;Balance training;Stair training;Functional mobility training;Therapeutic activities;Patient/family education;Modalities    PT Goals (Current goals can be found in the Care Plan section)  Acute Rehab PT Goals Patient Stated Goal: none stated PT Goal Formulation: Patient unable to participate in goal setting Time For Goal Achievement: 01/31/22    Frequency BID     Co-evaluation               AM-PAC PT "6 Clicks" Mobility  Outcome Measure Help needed turning from your back to your side while in a flat bed without using bedrails?: A Lot Help needed moving from lying on your back to sitting on the side of a flat bed without using bedrails?: A Lot Help needed moving to and from a bed to a chair (including a  wheelchair)?: A Little Help needed standing up from a chair using your arms (e.g., wheelchair or bedside chair)?: A Little Help needed to walk in hospital room?: A Lot Help needed climbing 3-5 steps with a railing? : A Lot 6 Click Score: 14    End of Session Equipment Utilized During Treatment: Gait belt Activity Tolerance: Patient limited by pain Patient left: in bed;with call bell/phone within reach;with nursing/sitter in room;with SCD's reapplied Nurse Communication: Mobility status PT Visit Diagnosis: Unsteadiness on feet (R26.81);Other abnormalities of gait and mobility (R26.89);Muscle weakness (generalized) (M62.81);Difficulty in walking, not elsewhere classified (R26.2);Pain Pain - Right/Left: Right Pain - part of body: Knee    Time: 1406-1430 PT Time Calculation (min) (ACUTE ONLY): 24 min   Charges:   PT Evaluation $PT Eval Moderate Complexity: 1 Mod PT Treatments $Therapeutic Activity: 8-22 mins        Davonta Stroot, PT, GCS 01/17/22,2:56 PM

## 2022-01-17 NOTE — Anesthesia Postprocedure Evaluation (Signed)
Anesthesia Post Note  Patient: Martha [name]  Procedure(s) Performed: TOTAL KNEE ARTHROPLASTY (Right: Knee)  Patient location during evaluation: PACU Anesthesia Type: Spinal Level of consciousness: awake and alert, oriented and patient cooperative Pain management: pain level controlled Vital Signs Assessment: post-procedure vital signs reviewed and stable Respiratory status: spontaneous breathing, nonlabored ventilation and respiratory function stable Cardiovascular status: blood pressure returned to baseline and stable Postop Assessment: adequate PO intake Anesthetic complications: no   No notable events documented.   Last Vitals:  Vitals:   01/17/22 1156 01/17/22 1207  BP:  123/77  Pulse: 66 66  Resp: (!) 21 14  Temp:    SpO2: 94% 97%    Last Pain:  Vitals:   01/17/22 1207  TempSrc:   PainSc: [place]

## 2022-01-17 NOTE — Progress Notes (Signed)
Met with the patient in the room at the bedside The patient lives at Home with her daughter The patient  currently has DME rolator The patient will need Rolling walker and 3 in 1 to be delivered by [employer] to the bedside They have transportation with daughter They can afford their medication  They are set up with [facility] for Home health services

## 2022-01-17 NOTE — Anesthesia Procedure Notes (Signed)
Spinal  Patient location during procedure: OR Start time: 01/17/2022 7:37 AM End time: 01/17/2022 7:40 AM Reason for block: surgical anesthesia Staffing Performed: resident/CRNA  Anesthesiologist: Darrin Nipper, MD Resident/CRNA: Aline Brochure, CRNA Preanesthetic Checklist Completed: patient identified, IV checked, site marked, risks and benefits discussed, surgical consent, monitors and equipment checked, pre-op evaluation and timeout performed Spinal Block Patient position: sitting Prep: ChloraPrep Patient monitoring: heart rate, continuous pulse ox, blood pressure and cardiac monitor Approach: midline Location: L4-5 Injection technique: single-shot Needle Needle type: Whitacre and Introducer  Needle gauge: 24 G Needle length: 9 cm Assessment Events: CSF return Additional Notes Negative paresthesia. Negative blood return. Positive free-flowing CSF. Expiration date of kit checked and confirmed. Patient tolerated procedure well, without complications.

## 2022-01-17 NOTE — OR Nursing (Incomplete)
Glucose 210,  given 5 units of novolog insulin per MD's order. Continue to monitor.

## 2022-01-17 NOTE — Op Note (Signed)
01/17/2022  10:09 AM  PATIENT:  Martha [name]   MRN: [id number]  PRE-OPERATIVE DIAGNOSIS:  Primary localized osteoarthritis of right knee   POST-OPERATIVE DIAGNOSIS:  Same   PROCEDURE:  Procedure(s): Right TOTAL KNEE ARTHROPLASTY   SURGEON: Laurene Footman, MD   ASSISTANTS: Rachelle Hora, PA-C   ANESTHESIA:   spinal   EBL: 150 cc   BLOOD ADMINISTERED:none   DRAINS:  Incisional wound VAC     LOCAL MEDICATIONS USED:  MARCAINE    and OTHER Exparel morphine   SPECIMEN:  No Specimen   DISPOSITION OF SPECIMEN:  N/A   COUNTS:  YES   TOURNIQUET: 32 minutes at 300 mmHg    IMPLANTS: Medacta  GMK revision system with 4 right PS with 18 x 65 mm stem femur, 3 right with 3 mm offset 15 x 65 stem tibia with  and 12 mm semiconstrained  insert.  Size 2 patella, all components cemented.   DICTATION: Viviann Spare Dictation   patient was brought to the operating room and spinal anesthesia was obtained.  After prepping and draping the right leg in sterile fashion, and after patient identification and timeout procedures were completed, a midline skin incision was made followed by medial parapatellar arthrotomy with severe medial compartment osteoarthritis, severe patellofemoral arthritis and severe lateral compartment arthritis, partial synovectomy was also carried out.  There was significant erosion and spurring throughout the knee made the case much more difficult than usual.  The ACL was deficient and PCL and fat pad were excised along with anterior horns of the meniscus.  The intramedullary guide was used to cut the distal femur followed by sizing and doing anterior posterior and chamfer cuts with a size 4.   The intramedullary system for the tibia revision system was utilized with sequential hand reaming followed by proximal cut to get down to bone with significant bone loss medially and laterally getting down to where there is no longer a defect in the bone this bone was removed and the tibia  sized to a size 3.  3 mm offset was appropriate and position determined followed by proximal preparation for 3 mm offset and keel punch.  Trial was then placed and attention was turned back to the femur for the revision femoral stem with a box cut made through the notch with a 0 mm offset present.  After hand reaming the canal to 18 the box cut was performed and trial was placed with a 12 mm insert giving good stability in flexion and extension and normal tracking the patella.  At this point the above local was injected throughout the knee.  trials were then removed the patella was cut using the patellar cutting guide and it sized to a size 2 after drill holes have been made tourniquet was raised at this point.  The knee was irrigated with pulsatile lavage and the bony surfaces dried the tibial component was cemented into place first.  Excess cement was removed and the tibial trial insert placed.  The distal femoral component was placed and the knee was held in extension as the patellar button was clamped into place.  After the cement was set, excess cement was removed and the knee was again irrigated thoroughly thoroughly irrigated.  The 12 mm insert was placed and setscrew tightened with a torque screwdriver.  The tourniquet was let down and hemostasis checked with electrocautery. The arthrotomy was repaired with a heavy Quill suture,  followed by 3-0 V lock subcuticular closure, skin staples followed  by incisional wound VAC and Polar Care.Marland Kitchen   PLAN OF CARE: Admit for overnight observation   PATIENT DISPOSITION:  PACU - hemodynamically stable.

## 2022-01-17 NOTE — Progress Notes (Cosign Needed)
Patient is not able to walk the distance required to go the bathroom, or he/she is unable to safely negotiate stairs required to access the bathroom.  A 3in1 BSC will alleviate this problem  

## 2022-01-17 NOTE — H&P (Signed)
Chief Complaint  Patient presents with   Pre-op Exam  Scheduled for Right TKA 01/17/22 with Dr. Rudene Christians  Language Line: Ilda Mori [phone]    History of the Present Illness: Martha [name] is a 66 y.o. female here today for history and physical for right total knee arthroplasty with Dr. Hessie Knows on 01/17/2022. Patient has severe advanced right knee osteoarthritis with complete loss of joint space and subluxation of the femur along the tibia. Her knee pain is been debilitating and interferes her quality life and activities daily living. She has a hard time walking and mostly is in a wheelchair but is able to walk some with a cane. Patient has failed conservative treatment. Pain is severe located throughout the right knee with instability and swelling.  The patient's daughter mentions she will bring her to physical therapy.  I have reviewed past medical, surgical, social and family history, and allergies as documented in the EMR.  Past Medical History: Past Medical History:  Diagnosis Date   Cancer (CMS-HCC)  uterine   Chickenpox   DDD (degenerative disc disease), lumbar   Hypertension   Hypothyroidism   Obesity   Vitamin D deficiency   Past Surgical History: Past Surgical History:  Procedure Laterality Date   ABDOMINAL HYSTERECTOMY   bladder surgery   right breast tumor   Past Family History: Family History  Problem Relation Age of Onset   Cancer Mother   Rectal cancer Father   Medications: Current Outpatient Medications Ordered in Epic  Medication Sig Dispense Refill   empagliflozin (JARDIANCE) 25 mg tablet Take 1 tablet (10 mg total) by mouth once daily   gabapentin (NEURONTIN) 800 MG tablet Take 800 mg by mouth 3 (three) times a day.   losartan (COZAAR) 100 MG tablet Take 100 mg by mouth once daily   naproxen (NAPROSYN) 500 MG tablet Take 500 mg by mouth 2 (two) times daily.   spironolactone (ALDACTONE) 50 MG tablet Take 50 mg by mouth 2 (two) times daily   TRULICITY 1.5  LG/9.2 mL subcutaneous pen injector Inject 1.5 mLs subcutaneously once daily   No current Epic-ordered facility-administered medications on file.   Allergies: Allergies  Allergen Reactions   Atorvastatin Unknown   Bactrim [Sulfamethoxazole-Trimethoprim] Dizziness  Hot and Cold Sweats and Tongue Numbness   Pravastatin Other (See Comments)  chest inflammation   Hydrochlorothiazide Other (See Comments)  Chest pain   Metoprolol Other (See Comments)  Other reaction(s): blood per rectum   Diclofenac Itching and Rash    Body mass index is 41.2 kg/m.  Review of Systems: A comprehensive 14 point ROS was performed, reviewed, and the pertinent orthopaedic findings are documented in the HPI.  Vitals:  01/03/22 1429  BP: 118/64    General Physical Examination:   General:  Well developed, well nourished, no apparent distress, normal affect, antalgic gait with a cane  HEENT: Head normocephalic, atraumatic, PERRL.   Abdomen: Soft, non tender, non distended, Bowel sounds present.  Heart: Examination of the heart reveals regular, rate, and rhythm. There is no murmur noted on ascultation. There is a normal apical pulse.  Lungs: Lungs are clear to auscultation. There is no wheeze, rhonchi, or crackles. There is normal expansion of bilateral chest walls.   Musculoskeletal Examination: On exam, right knee range of motion is 0 to 95 degrees with marked crepitation and marked deformity. No valgus or varus deformity. No swelling or edema throughout the right lower leg. Hip moves well with internal ex rotation.  Radiographs: AP, lateral, standing,  and sunrise x-rays of the right knee were reviewed today. These show marked deformity and subluxation, approximately 30 percent lateral subluxation of the tibia, extensive cyst and osteophyte formation, large osteophytes anteriorly and posteriorly at the tibia, severe patellofemoral degenerative change with erosion of the lateral facet on the  right.      Assessment: ICD-10-CM  1. Primary osteoarthritis of right knee M17.11   Plan: 18. 66 year old female with advanced right knee osteoarthritis. She has complete loss of joint space with tricompartmental degenerative changes with subluxation of the femur along the tibia. Patient has failed all conservative treatment and patient's pain is severe and debilitating limiting her quality of life and activities day living. Risks, benefits, complications of a right total knee arthroplasty have been discussed with the patient. Patient has agreed and consented the procedure with Dr. Hessie Knows on 01/17/2022.  Surgical Risks:  The nature of the condition and the proposed procedure has been reviewed in detail with the patient. Surgical versus non-surgical options and prognosis for recovery have been reviewed and the inherent risks and benefits of each have been discussed including the risks of infection, bleeding, injury to nerves / blood vessels / tendons, incomplete relief of symptoms, persisting pain and / or stiffness, loss of function, complex regional pain syndrome, failure of procedure, as appropriate.   Electronically signed by Feliberto Gottron, PA at 01/03/2022 3:00 PM EDT  Reviewed  H+P. No changes noted.

## 2022-01-17 NOTE — Transfer of Care (Signed)
Immediate Anesthesia Transfer of Care Note  Patient: Martha [name]  Procedure(s) Performed: TOTAL KNEE ARTHROPLASTY (Right: Knee)  Patient Location: PACU  Anesthesia Type:Spinal  Level of Consciousness: awake, alert  and oriented  Airway & Oxygen Therapy: Patient Spontanous Breathing  Post-op Assessment: Report given to RN and Post -op Vital signs reviewed and stable  Post vital signs: Reviewed and stable  Last Vitals:  Vitals Value Taken Time  BP 91/47 01/17/22 1001  Temp    Pulse 93 01/17/22 1003  Resp    SpO2 93 % 01/17/22 1003  Vitals shown include unvalidated device data.  Last Pain:  Vitals:   01/17/22 0629  TempSrc: Temporal  PainSc: 0-No pain         Complications: No notable events documented.

## 2022-01-17 NOTE — Progress Notes (Signed)
Patient awake/alert x4.  Moving bil lower ext, patient states pain come's in "waves" Eating lunch without event, PT aware of patient in pacu waiting on bed assignment. Will continue to monitor

## 2022-01-17 NOTE — Progress Notes (Signed)
Patient awake/alert x4.  Able to move bil lower ext, bending left knee. Having discomfort to right knee, medicated as ordered. Patient able to talk with her daughter on phone. Comfortable, vitals at baseline. Waiting on bed assignment.

## 2022-01-18 ENCOUNTER — Encounter: Payer: Self-pay | Admitting: Orthopedic Surgery

## 2022-01-18 DIAGNOSIS — M1711 Unilateral primary osteoarthritis, right knee: Secondary | ICD-10-CM | POA: Diagnosis not present

## 2022-01-18 LAB — GLUCOSE, CAPILLARY
Glucose-Capillary: 209 mg/dL — ABNORMAL HIGH (ref 70–99)
Glucose-Capillary: 244 mg/dL — ABNORMAL HIGH (ref 70–99)
Glucose-Capillary: 226 mg/dL — ABNORMAL HIGH (ref 70–99)
Glucose-Capillary: 185 mg/dL — ABNORMAL HIGH (ref 70–99)

## 2022-01-18 MED ORDER — TRAMADOL HCL 50 MG PO TABS: 50.0000 mg | ORAL_TABLET | Freq: Four times a day (QID) | ORAL | 0 refills | Status: DC | PRN

## 2022-01-18 MED ORDER — DOCUSATE SODIUM 100 MG PO CAPS: 100.0000 mg | ORAL_CAPSULE | Freq: Two times a day (BID) | ORAL | 0 refills | Status: DC

## 2022-01-18 MED ORDER — METHOCARBAMOL 500 MG PO TABS: 500.0000 mg | ORAL_TABLET | Freq: Four times a day (QID) | ORAL | 0 refills | Status: DC | PRN

## 2022-01-18 MED ORDER — ENOXAPARIN SODIUM 40 MG/0.4ML IJ SOSY: 40.0000 mg | PREFILLED_SYRINGE | INTRAMUSCULAR | 0 refills | Status: DC

## 2022-01-18 MED ORDER — HYDROCODONE-ACETAMINOPHEN 5-325 MG PO TABS: 1.0000 | ORAL_TABLET | ORAL | 0 refills | Status: DC | PRN

## 2022-01-18 NOTE — Progress Notes (Signed)
   Subjective: 1 Day Post-Op Procedure(s) (LRB): TOTAL KNEE ARTHROPLASTY (Right) Patient reports pain as mild.   Patient is well, and has had no acute complaints or problems Denies any CP, SOB, ABD pain. We will continue therapy today.  Plan is to go Home after hospital stay.  Objective: Vital signs in last 24 hours: Temp:  [97.3 F (36.3 C)-98.3 F (36.8 C)] 98.3 F (36.8 C) (06/02 0349) Pulse Rate:  [66-99] 99 (06/02 0349) Resp:  [14-25] 16 (06/02 0349) BP: (91-188)/(47-97) 149/83 (06/02 0349) SpO2:  [92 %-98 %] 92 % (06/02 0349)  Intake/Output from previous day: 06/01 0701 - 06/02 0700 In: 1125 [P.O.:325; I.V.:400; IV Piggyback:400] Out: 2050 [Urine:2000; Blood:50] Intake/Output this shift: No intake/output data recorded.  Recent Labs    01/17/22 1515  HGB 12.5   Recent Labs    01/17/22 1515  WBC 25.2*  RBC 4.38  HCT 38.6  PLT 296   Recent Labs    01/17/22 1515  CREATININE 0.52   No results for input(s): LABPT, INR in the last 72 hours.  EXAM General - Patient is Alert, Appropriate, and Oriented Extremity - ABD soft Sensation intact distally Intact pulses distally Dorsiflexion/Plantar flexion intact Dressing - dressing C/D/I and no drainage, provena intact with out drainage Motor Function - intact, moving foot and toes well on exam.   Past Medical History:  Diagnosis Date   Anemia    Arthritis    COVID-19 2020   DOE (dyspnea on exertion)    HLD (hyperlipidemia)    HTN (hypertension)    T2DM (type 2 diabetes mellitus) (HCC)    Uterine cancer (HCC)     Assessment/Plan:   1 Day Post-Op Procedure(s) (LRB): TOTAL KNEE ARTHROPLASTY (Right) Principal Problem:   S/P TKR (total knee replacement) using cement, right  Estimated body mass index is 41.2 kg/m as calculated from the following:   Height as of this encounter: '5\' 4"'$  (1.626 m).   Weight as of this encounter: 108.9 kg. Advance diet Up with therapy Patient doing well, pain well  controlled Vital signs are stable Labs stable Care management to assist with discharge pending PT recommendation   DVT Prophylaxis - Lovenox, TED hose, and SCDs Weight-Bearing as tolerated to right leg   T. Rachelle Hora, PA-C [place] 01/18/2022, 7:14 AM

## 2022-01-18 NOTE — Progress Notes (Signed)
Physical Therapy Treatment Patient Details Name: Martha [name] MRN: [id number] DOB: September 04, 1955 Today's Date: 01/18/2022   History of Present Illness Patient is a 66 year old female s/p R TKA.    PT Comments    Pt was long sitting in bed. Interpreter used throughout session.  Agrees to session and is cooperative throughout. She continues to demonstrate improving mobility, strength, and ROM. She was easily able to stand and ambulate with RW but was unwilling to ambulate into hallway. Demonstrates AROM 2-86 degree while seated EOB.Issued spanish HEP and pt was able to perform all without physical assistance. Author planning to return in the morning and progress pt with ambulation and stair training. She is planning to DC home tomorrow as long as she continues to improve and pain is under control. Pt will benefit from HHPT at DC to address deficits while maximizing independence with ADLs.    Recommendations for follow up therapy are one component of a multi-disciplinary discharge planning process, led by the attending physician.  Recommendations may be updated based on patient status, additional functional criteria and insurance authorization.  Follow Up Recommendations  Home health PT     Assistance Recommended at Discharge Frequent or constant Supervision/Assistance  Patient can return home with the following A little help with walking and/or transfers;A little help with bathing/dressing/bathroom;Assistance with cooking/housework;Assist for transportation;Help with stairs or ramp for entrance   Equipment Recommendations  Rolling walker (2 wheels)       Precautions / Restrictions Precautions Precautions: Fall;Knee Precaution Booklet Issued: No Restrictions Weight Bearing Restrictions: Yes RLE Weight Bearing: Weight bearing as tolerated     Mobility  Bed Mobility Overal bed mobility: Needs Assistance Bed Mobility: Supine to Sit, Sit to Supine  Supine to sit: Min assist Sit  to supine: Min assist   Transfers Overall transfer level: Needs assistance Equipment used: Rolling walker (2 wheels) Transfers: Sit to/from Stand Sit to Stand: Min guard      General transfer comment: CGA for safety. Vcs for hand placement, fwd wt shift , and overall imporved technique    Ambulation/Gait Ambulation/Gait assistance: Supervision Gait Distance (Feet): 25 Feet Assistive device: Rolling walker (2 wheels) Gait Pattern/deviations: Step-through pattern Gait velocity: decrease     General Gait Details: pt demonstrated improved gait kinematics this session but continues to c/o dizziness. Easily able to ambulate to/from BR but unwilling to ambulate into hallway     Balance Overall balance assessment: Needs assistance Sitting-balance support: Feet supported Sitting balance-Leahy Scale: Good     Standing balance support: Bilateral upper extremity supported, During functional activity, Reliant on assistive device for balance Standing balance-Leahy Scale: Good Standing balance comment: requires assistance and cues for safety       Cognition Arousal/Alertness: Awake/alert Behavior During Therapy: WFL for tasks assessed/performed Overall Cognitive Status: Within Functional Limits for tasks assessed    General Comments: Pt is A and O x 4        Exercises Total Joint Exercises Ankle Circles/Pumps: AROM Quad Sets: AROM Gluteal Sets: 10 reps Heel Slides: AROM, 10 reps Hip ABduction/ADduction: AROM Straight Leg Raises: AROM Goniometric ROM: 2-86 degrees        Pertinent Vitals/Pain Pain Assessment Pain Assessment: 0-10 Pain Score: 5  Faces Pain Scale: Hurts little more Pain Location: R knee Pain Descriptors / Indicators: Discomfort, Grimacing, Guarding, Moaning Pain Intervention(s): Limited activity within patient's tolerance, Monitored during session, Premedicated before session, Repositioned, Ice applied    Home Living Family/patient expects to be  discharged to:: Private  residence Living Arrangements: Children Available Help at Discharge: Family;Available 24 hours/day Type of Home: House Home Access: Stairs to enter Entrance Stairs-Rails: Psychiatric nurse of Steps: 3-4   Home Layout: One level Home Equipment: Conservation officer, nature (2 wheels);Wheelchair - manual      Prior Function            PT Goals (current goals can now be found in the care plan section) Acute Rehab PT Goals Patient Stated Goal: none stated Progress towards PT goals: Progressing toward goals    Frequency    BID      PT Plan Current plan remains appropriate       AM-PAC PT "6 Clicks" Mobility   Outcome Measure  Help needed turning from your back to your side while in a flat bed without using bedrails?: A Little Help needed moving from lying on your back to sitting on the side of a flat bed without using bedrails?: A Little Help needed moving to and from a bed to a chair (including a wheelchair)?: A Little Help needed standing up from a chair using your arms (e.g., wheelchair or bedside chair)?: A Little Help needed to walk in hospital room?: A Little Help needed climbing 3-5 steps with a railing? : A Lot 6 Click Score: 17    End of Session Equipment Utilized During Treatment: Gait belt Activity Tolerance: Patient tolerated treatment well Patient left: in bed;with call bell/phone within reach;with nursing/sitter in room;with SCD's reapplied Nurse Communication: Mobility status PT Visit Diagnosis: Unsteadiness on feet (R26.81);Other abnormalities of gait and mobility (R26.89);Muscle weakness (generalized) (M62.81);Difficulty in walking, not elsewhere classified (R26.2);Pain Pain - Right/Left: Right Pain - part of body: Knee     Time: 6073-7106 PT Time Calculation (min) (ACUTE ONLY): 26 min  Charges:  $Gait Training: 8-22 mins $Therapeutic Exercise: 8-22 mins $Therapeutic Activity: 8-22 mins                    Julaine Fusi PTA 01/18/22, 4:20 PM

## 2022-01-18 NOTE — Evaluation (Signed)
Occupational Therapy Evaluation Patient Details Name: Martha [name] MRN: [id number] DOB: 11-07-1955 Today's Date: 01/18/2022   History of Present Illness Patient is a 66 year old female s/p R TKA.    Clinical Impression  Upon arrival pt. was very tearful sitting in a straight chair along the wall near the foot end of her bed. [place] language services were activated with the assistance of Vicente Males through the portable teleservices. Pt. Reported that she had walked from the bathroom toilet to the chair by herself after being left alone in the bathroom. Pt. Had no nurse call button available to her from the chair she was sitting in. Nursing was notified. Pt. Was assisted back to bed. Pt. presents with weakness, limited activity tolerance, and limited functional mobility which hinder her ability to complete basic ADL and IADL functioning. Pt. resides at home with her daughter. Pt. was previously independent with basic ADLs, and IADL functioning, however the tasks became progressively more difficult due to knee limitations. Pt. required minA to walk from a straight chair along the wall at the end of her bed to her bed. Pt. required verbal cues. Pt. required minA to transition from sitting to supine. Pt. was able to scoot towards the [facility] with modified independence. Pt. will benefit from OT services for ADL training, A/E training, and pt. education about home modification, and DME. Pt. plans to return home upon discharge with family to assist pt. as needed. Pt. Will benefit from follow-up [place] services upon discharge.      Recommendations for follow up therapy are one component of a multi-disciplinary discharge planning process, led by the attending physician.  Recommendations may be updated based on patient status, additional functional criteria and insurance authorization.   Follow Up Recommendations  Home health OT    Assistance Recommended at Discharge    Patient can return home with the following  A little help with walking and/or transfers;Assistance with cooking/housework;A little help with bathing/dressing/bathroom;Help with stairs or ramp for entrance    Functional Status Assessment     Equipment Recommendations  BSC/3in1    Recommendations for Other Services       Precautions / Restrictions Precautions Precautions: Fall;Knee Precaution Booklet Issued: No Restrictions Weight Bearing Restrictions: Yes RLE Weight Bearing: Weight bearing as tolerated      Mobility Bed Mobility Overal bed mobility: Needs Assistance Bed Mobility: Sit to Supine       Sit to supine: Min assist        Transfers   Equipment used: Rolling walker (2 wheels) Transfers: Sit to/from Stand Sit to Stand: Min assist           General transfer comment: MinA to maneuver through obstacles in her room.      Balance                                           ADL either performed or assessed with clinical judgement   ADL Overall ADL's : Needs assistance/impaired Eating/Feeding: Independent;Set up   Grooming: Set up;Independent       Lower Body Bathing: Maximal assistance       Lower Body Dressing: Maximal assistance               Functional mobility during ADLs: Minimal assistance       Vision Patient Visual Report: No change from baseline  Perception     Praxis      Pertinent Vitals/Pain Pain Assessment Pain Assessment: No/denies pain Pain Descriptors / Indicators: Discomfort, Grimacing, Guarding, Moaning     Hand Dominance     Extremity/Trunk Assessment Upper Extremity Assessment Upper Extremity Assessment: Overall WFL for tasks assessed           Communication Communication Communication: Prefers language other than English   Cognition Arousal/Alertness: Awake/alert Behavior During Therapy: WFL for tasks assessed/performed Overall Cognitive Status: Within Functional Limits for tasks assessed                                        General Comments       Exercises     Shoulder Instructions      Home Living Family/patient expects to be discharged to:: Private residence Living Arrangements: Children Available Help at Discharge: Family;Available 24 hours/day Type of Home: House Home Access: Stairs to enter [employer] of Steps: 3-4 Entrance Stairs-Rails: Right;Left Home Layout: One level               Home Equipment: Conservation officer, nature (2 wheels);Wheelchair - manual          Prior Functioning/Environment Prior Level of Function : Independent/Modified Independent             Mobility Comments: Pt.  was independent prior to surgery ADLs Comments: Independent        OT Problem List: Decreased strength;Impaired UE functional use;Decreased activity tolerance;Decreased knowledge of use of DME or AE;Decreased range of motion      OT Treatment/Interventions: Self-care/ADL training;DME and/or AE instruction;Therapeutic activities;Therapeutic exercise;Patient/family education    OT Goals(Current goals can be found in the care plan section) Acute Rehab OT Goals Patient Stated Goal: To go home OT Goal Formulation: With patient Time For Goal Achievement: 01/18/22 Potential to Achieve Goals: Good  OT Frequency: Min 2X/week    Co-evaluation              AM-PAC OT "6 Clicks" Daily Activity     Outcome Measure Help from another person eating meals?: None Help from another person taking care of personal grooming?: A Little Help from another person toileting, which includes using toliet, bedpan, or urinal?: A Little Help from another person bathing (including washing, rinsing, drying)?: A Lot Help from another person to put on and taking off regular upper body clothing?: A Little Help from another person to put on and taking off regular lower body clothing?: A Lot 6 Click Score: 17   End of Session Equipment Utilized During Treatment: Gait belt  Activity  Tolerance: Patient tolerated treatment well Patient left: in bed;with call bell/phone within reach;with bed alarm set;with family/visitor present  OT Visit Diagnosis: Unsteadiness on feet (R26.81);Muscle weakness (generalized) (M62.81)                Time: 1320-1400 OT Time Calculation (min): 40 min Charges:  OT General Charges $OT Visit: 1 Visit OT Evaluation $OT Eval Moderate Complexity: 1 Mod  Harrel Carina, MS, OTR/L   Harrel Carina 01/18/2022, 3:29 PM

## 2022-01-18 NOTE — Progress Notes (Signed)
Physical Therapy Treatment Patient Details Name: Martha [name] MRN: [id number] DOB: [birth year] Today's Date: 01/18/2022   History of Present Illness Patient is a 66 year old female s/p R TKA.    PT Comments    Pt was long sitting in bed upon arriving. Her supportive daughter was present and will be assisting pt at DC. Pt was able to exit bed, stand, and ambulate with RW to BR. Stood from toilet and proceeded to ambulate more however c/o severe dizziness and nausea. BP 190s/90s.     " I've had high BP since I was 66 years old." Author questions if nausea/dizziness is due to pain meds previous issued. Pt is motivated and Chief Strategy Officer expects her to progress well once dizziness and nausea improves. Will see pt when not just given pain medication this afternoon. Recommend HHPT at DC to progress to PLOF.     Recommendations for follow up therapy are one component of a multi-disciplinary discharge planning process, led by the attending physician.  Recommendations may be updated based on patient status, additional functional criteria and insurance authorization.  Follow Up Recommendations  Home health PT     Assistance Recommended at Discharge Frequent or constant Supervision/Assistance  Patient can return home with the following A lot of help with bathing/dressing/bathroom;A little help with walking and/or transfers;Assist for transportation;Help with stairs or ramp for entrance   Equipment Recommendations  Rolling walker (2 wheels)       Precautions / Restrictions Precautions Precautions: Fall;Knee Precaution Booklet Issued: No Restrictions Weight Bearing Restrictions: Yes RLE Weight Bearing: Weight bearing as tolerated     Mobility  Bed Mobility Overal bed mobility: Needs Assistance Bed Mobility: Supine to Sit, Sit to Supine  Supine to sit: Mod assist, HOB elevated, Min assist Sit to supine: Min assist     Transfers Overall transfer level: Needs assistance Equipment used:  Rolling walker (2 wheels) Transfers: Sit to/from Stand Sit to Stand: Min guard  General transfer comment: CGA for safety. no physical assistance required.    Ambulation/Gait Ambulation/Gait assistance: Min guard Gait Distance (Feet): 25 Feet Assistive device: Rolling walker (2 wheels) Gait Pattern/deviations: Step-to pattern, Decreased step length - right, Decreased step length - left, Decreased stride length, Antalgic Gait velocity: decrease     General Gait Details: pt was able to ambulate to BR and return. 2 x 25. does fatigue and c/o severe dizziness. Author questions if due to pain medications administered prior to session.       Balance Overall balance assessment: Needs assistance Sitting-balance support: Feet supported Sitting balance-Leahy Scale: Good     Standing balance support: Bilateral upper extremity supported, During functional activity, Reliant on assistive device for balance Standing balance-Leahy Scale: Fair       Cognition Arousal/Alertness: Awake/alert Behavior During Therapy: WFL for tasks assessed/performed      General Comments: Pt is A and O x 4. supportive daughter present throughout and very supportive. Will be home with pt at DC               Pertinent Vitals/Pain Pain Assessment Pain Assessment: 0-10 Pain Score: 6  Pain Location: R knee Pain Descriptors / Indicators: Discomfort, Grimacing, Guarding, Moaning Pain Intervention(s): Limited activity within patient's tolerance, Monitored during session, Premedicated before session, Repositioned, Ice applied     PT Goals (current goals can now be found in the care plan section) Acute Rehab PT Goals Patient Stated Goal: none stated Progress towards PT goals: Progressing toward goals    Frequency  BID      PT Plan Current plan remains appropriate       AM-PAC PT "6 Clicks" Mobility   Outcome Measure  Help needed turning from your back to your side while in a flat bed without  using bedrails?: A Little Help needed moving from lying on your back to sitting on the side of a flat bed without using bedrails?: A Lot Help needed moving to and from a bed to a chair (including a wheelchair)?: A Little Help needed standing up from a chair using your arms (e.g., wheelchair or bedside chair)?: A Little Help needed to walk in hospital room?: A Little Help needed climbing 3-5 steps with a railing? : A Lot 6 Click Score: 16    End of Session   Activity Tolerance: Patient tolerated treatment well Patient left: in bed;with call bell/phone within reach;with nursing/sitter in room;with SCD's reapplied Nurse Communication: Mobility status PT Visit Diagnosis: Unsteadiness on feet (R26.81);Other abnormalities of gait and mobility (R26.89);Muscle weakness (generalized) (M62.81);Difficulty in walking, not elsewhere classified (R26.2);Pain Pain - Right/Left: Right     Time: 1438-8875 PT Time Calculation (min) (ACUTE ONLY): 27 min  Charges:  $Gait Training: 8-22 mins $Therapeutic Activity: 8-22 mins                    Julaine Fusi PTA 01/18/22, 1:29 PM

## 2022-01-18 NOTE — Discharge Summary (Signed)
Physician Discharge Summary  Patient ID: Martha [name] MRN: [id number] DOB/AGE: 1955/09/11 66 y.o.  Admit date: 01/17/2022 Discharge date: 01/19/2022  Admission Diagnoses:  S/P TKR (total knee replacement) using cement, right [Z96.651]  Discharge Diagnoses: Patient Active Problem List   Diagnosis Date Noted   S/P TKR (total knee replacement) using cement, right 01/17/2022    Past Medical History:  Diagnosis Date   Anemia    Arthritis    COVID-19 2020   DOE (dyspnea on exertion)    HLD (hyperlipidemia)    HTN (hypertension)    T2DM (type 2 diabetes mellitus) ([place])    Uterine cancer ([place])      Transfusion: none   Consultants (if any):   Discharged Condition: Improved  Hospital Course: Martha [name] is an 66 y.o. female who was admitted 01/17/2022 with a diagnosis of S/P TKR (total knee replacement) using cement, right and went to the operating room on 01/17/2022 and underwent the above named procedures.    Surgeries: Procedure(s): TOTAL KNEE ARTHROPLASTY on 01/17/2022 Patient tolerated the surgery well. Taken to PACU where she was stabilized and then transferred to the orthopedic floor.  Started on Lovenox 30 mg q 12 hrs. Foot pumps applied bilaterally at 80 mm. Heels elevated on bed with rolled towels. No evidence of DVT. Negative [place]. Physical therapy started on day #1 for gait training and transfer. OT started day #1 for ADL and assisted devices.  Patient's foley was d/c on day #1. Patient's IV was d/c on day #2.  On post op day #2 patient was stable and ready for discharge to home with HHPT.  She was given perioperative antibiotics:  Anti-infectives (From admission, onward)    Start     Dose/Rate Route Frequency Ordered Stop   01/17/22 1400  ceFAZolin (ANCEF) IVPB 2g/100 mL premix        2 g 200 mL/hr over 30 Minutes Intravenous Every 6 hours 01/17/22 1356 01/17/22 2050   01/17/22 1357  ceFAZolin (ANCEF) 2-4 GM/100ML-% IVPB       Note to Pharmacy:  Marni Griffon K: cabinet override      01/17/22 1357 01/18/22 0159   01/17/22 0652  ceFAZolin (ANCEF) 2-4 GM/100ML-% IVPB       Note to Pharmacy: Olena Mater F: cabinet override      01/17/22 0652 01/17/22 1509   01/17/22 0600  ceFAZolin (ANCEF) IVPB 2g/100 mL premix        2 g 200 mL/hr over 30 Minutes Intravenous On call to O.R. 01/17/22 0131 01/17/22 0759     .  She was given sequential compression devices, early ambulation, and Lovenox TEDs for DVT prophylaxis.  She benefited maximally from the hospital stay and there were no complications.    Recent vital signs:  Vitals:   01/19/22 0545 01/19/22 0809  BP: 115/62 113/66  Pulse: 100 100  Resp: 18 18  Temp: 98.1 F (36.7 C) 99 F (37.2 C)  SpO2: 92% 95%    Recent laboratory studies:  Lab Results  Component Value Date   HGB 10.8 (L) 01/19/2022   HGB 12.5 01/17/2022   HGB 15.1 (H) 01/03/2022   Lab Results  Component Value Date   WBC 18.9 (H) 01/19/2022   PLT 257 01/19/2022   No results found for: INR Lab Results  Component Value Date   NA 135 01/03/2022   K 3.9 01/03/2022   CL 100 01/03/2022   CO2 24 01/03/2022   BUN 18 01/03/2022   CREATININE 0.52  01/17/2022   GLUCOSE 145 (H) 01/03/2022    Discharge Medications:   Allergies as of 01/19/2022       Reactions   Atorvastatin Shortness Of Breath   Pravastatin Shortness Of Breath   Sulfamethoxazole-trimethoprim Other (See Comments)   Cold sweats, could not walk Other reaction(s): Dizziness Hot and Cold Sweats and Tongue Numbness    Hydrochlorothiazide    Metoprolol         Medication List     STOP taking these medications    naproxen 500 MG tablet Commonly known as: NAPROSYN       TAKE these medications    docusate sodium 100 MG capsule Commonly known as: COLACE Take 1 capsule (100 mg total) by mouth 2 (two) times daily.   empagliflozin 25 MG Tabs tablet Commonly known as: JARDIANCE Take 25 mg by mouth daily after lunch.    enoxaparin 40 MG/0.4ML injection Commonly known as: LOVENOX Inject 0.4 mLs (40 mg total) into the skin daily for 14 days.   gabapentin 800 MG tablet Commonly known as: NEURONTIN Take 800 mg by mouth at bedtime.   HYDROcodone-acetaminophen 5-325 MG tablet Commonly known as: NORCO/VICODIN Take 1-2 tablets by mouth every 4 (four) hours as needed for moderate pain (pain score 4-6).   losartan 100 MG tablet Commonly known as: COZAAR Take 100 mg by mouth at bedtime.   methocarbamol 500 MG tablet Commonly known as: ROBAXIN Take 1 tablet (500 mg total) by mouth every 6 (six) hours as needed for muscle spasms.   spironolactone 50 MG tablet Commonly known as: ALDACTONE Take 50 mg by mouth daily.   traMADol 50 MG tablet Commonly known as: ULTRAM Take 1 tablet (50 mg total) by mouth every 6 (six) hours as needed.   Trulicity 3 DV/7.6HY Sopn Generic drug: Dulaglutide Inject 3 mg into the skin once a week. 'Sunday               Durable Medical Equipment  (From admission, onward)           Start     Ordered   01/18/22 1530  DME Walker rolling  Once       Comments: youth  Question Answer Comment  Walker: With 5 Inch Wheels   Patient needs a walker to treat with the following condition S/P TKR (total knee replacement) using cement, right      06'$ /02/23 1529   01/17/22 1502  DME 3 n 1  Once       Comments: Bariatric   01/17/22 1501   01/17/22 1502  DME Bedside commode  Once       Comments: Bariatric  Question:  Patient needs a bedside commode to treat with the following condition  Answer:  S/P TKR (total knee replacement) using cement, right   01/17/22 1501            Diagnostic Studies: DG Knee 1-2 Views Right  Result Date: 01/17/2022 CLINICAL DATA:  66 year old female status post right knee replacement. EXAM: RIGHT KNEE - 1-2 VIEW COMPARISON:  Right knee series 07/23/2011. FINDINGS: AP and cross-table lateral views at 1020 hours. Right total knee arthroplasty  hardware appears intact and aligned. Small volume gas and fluid in the joint space. Anterior skin staples. Postoperative changes to the undersurface of the patella. No adverse features identified. IMPRESSION: Right total knee arthroplasty with no adverse features. Electronically Signed   By: Genevie Ann M.D.   On: 01/17/2022 10:39   MM 3D SCREEN BREAST BILATERAL  Result Date: 01/11/2022 CLINICAL DATA:  Screening. EXAM: DIGITAL SCREENING BILATERAL MAMMOGRAM WITH TOMOSYNTHESIS AND CAD TECHNIQUE: Bilateral screening digital craniocaudal and mediolateral oblique mammograms were obtained. Bilateral screening digital breast tomosynthesis was performed. The images were evaluated with computer-aided detection. COMPARISON:  Previous exam(s). ACR Breast Density Category b: There are scattered areas of fibroglandular density. FINDINGS: There are no findings suspicious for malignancy. IMPRESSION: No mammographic evidence of malignancy. A result letter of this screening mammogram will be mailed directly to the patient. RECOMMENDATION: Screening mammogram in one year. (Code:SM-B-01Y) BI-RADS CATEGORY  1: Negative. Electronically Signed   By: Dorise Bullion III M.D.   On: 01/11/2022 15:32    Disposition: Plan for discharge home with HHPT today, 01/19/22, pending progress with therapy.  Switch to mobile Prevena cannister.     Follow-up Information     Duanne Guess, PA-C Follow up in 2 week(s).   Specialties: Orthopedic Surgery, Emergency Medicine Contact information: [place] [address] [phone]                  Signed: Judson Roch PA-C 01/19/2022, 8:37 AM

## 2022-01-18 NOTE — Plan of Care (Signed)

## 2022-01-18 NOTE — Progress Notes (Signed)
OT Cancellation Note  Patient Details Name: Martha [name] MRN: [id number] DOB: August 20, 1955   Cancelled Treatment:    Reason Eval/Treat Not Completed: Medical issues which prohibited therapy (Per PT, pt.'s BP is190/90 this morning, and pt. is not feeling weel. Will reattempt the OT initial eval at a later time, or date.)  Harrel Carina, MS, OTR/L   Harrel Carina 01/18/2022, 10:42 AM

## 2022-01-18 NOTE — Discharge Instructions (Signed)

## 2022-01-19 DIAGNOSIS — M1711 Unilateral primary osteoarthritis, right knee: Secondary | ICD-10-CM | POA: Diagnosis not present

## 2022-01-19 LAB — CBC
HCT: 31.9 % — ABNORMAL LOW (ref 36.0–46.0)
Hemoglobin: 10.8 g/dL — ABNORMAL LOW (ref 12.0–15.0)
MCH: 29.2 pg (ref 26.0–34.0)
MCHC: 33.9 g/dL (ref 30.0–36.0)
MCV: 86.2 fL (ref 80.0–100.0)
Platelets: 257 10*3/uL (ref 150–400)
RBC: 3.7 MIL/uL — ABNORMAL LOW (ref 3.87–5.11)
RDW: 14.3 % (ref 11.5–15.5)
WBC: 18.9 10*3/uL — ABNORMAL HIGH (ref 4.0–10.5)
nRBC: 0 % (ref 0.0–0.2)

## 2022-01-19 LAB — GLUCOSE, CAPILLARY: Glucose-Capillary: 172 mg/dL — ABNORMAL HIGH (ref 70–99)

## 2022-01-19 NOTE — Progress Notes (Signed)
Subjective: 2 Days Post-Op Procedure(s) (LRB): TOTAL KNEE ARTHROPLASTY (Right) Patient reports pain as mild.  She states that she feels much better today compared to yesterday. Daughter helps with interpretation this morning. Patient is well, and has had no acute complaints or problems Denies any CP, SOB, ABD pain. We will continue therapy today.  Needs to complete stair training prior to going home. Blood glucose under better control today. Plan is to go Home after hospital stay. Patient is passing gas without pain this morning.  Objective: Vital signs in last 24 hours: Temp:  [97.9 F (36.6 C)-99.3 F (37.4 C)] 99 F (37.2 C) (06/03 0809) Pulse Rate:  [86-100] 100 (06/03 0809) Resp:  [18] 18 (06/03 0809) BP: (113-161)/(59-98) 113/66 (06/03 0809) SpO2:  [90 %-95 %] 95 % (06/03 0809)  Intake/Output from previous day: No intake/output data recorded. Intake/Output this shift: No intake/output data recorded.  Recent Labs    01/17/22 1515 01/19/22 0804  HGB 12.5 10.8*   Recent Labs    01/17/22 1515 01/19/22 0804  WBC 25.2* 18.9*  RBC 4.38 3.70*  HCT 38.6 31.9*  PLT 296 257   Recent Labs    01/17/22 1515  CREATININE 0.52   No results for input(s): LABPT, INR in the last 72 hours.  EXAM General - Patient is Alert, Appropriate, and Oriented Extremity - ABD soft Sensation intact distally Intact pulses distally Dorsiflexion/Plantar flexion intact Dressing - dressing C/D/I and no drainage, provena intact with out drainage Motor Function - intact, moving foot and toes well on exam.  Abdomen soft with normal bowel sounds this morning.  Past Medical History:  Diagnosis Date   Anemia    Arthritis    COVID-19 2020   DOE (dyspnea on exertion)    HLD (hyperlipidemia)    HTN (hypertension)    T2DM (type 2 diabetes mellitus) ([facility])    Uterine cancer ([facility])     Assessment/Plan:   2 Days Post-Op Procedure(s) (LRB): TOTAL KNEE ARTHROPLASTY (Right) Principal  Problem:   S/P TKR (total knee replacement) using cement, right  Estimated body mass index is 41.2 kg/m as calculated from the following:   Height as of this encounter: '5\' 4"'$  (1.626 m).   Weight as of this encounter: 108.9 kg. Advance diet Up with therapy  Pain is under much better control this morning with therapy. Up with therapy today, will need to complete stair training. Labs obtained, WBC trending down, 18.9 down from 25.2.  Low grade fever, HR 100 but no chest pain, SOB or cough. Patient is passing gas this morning. Plan for discharge home today pending progress with therapy.  DVT Prophylaxis - Lovenox, TED hose, and SCDs Weight-Bearing as tolerated to right leg  J. Cameron Proud, PA-C [place] 01/19/2022, 8:36 AM

## 2022-01-19 NOTE — Progress Notes (Signed)
Occupational Therapy Treatment Patient Details Name: Martha [name] MRN: [id number] DOB: April 17, 1956 Today's Date: 01/19/2022   History of present illness Patient is a 66 year old female s/p R TKA.   OT comments  Chart reviewed, pt greeted in room with daughter present. Interpreter Leonardo [phone] utilized via interpreter ipad. Tx session targeted improving activity tolerance for increased independence during ADL and education re: use of ice machine, safe ADL completion. Good carry over and comprehension from daughter who reports she will assist patient. Improvements noted in bed mobility, STS completing with MIN A with intermittent vcs. Pt is left as received, NAD, all needs met. Discharge recommendation remains appropriate.    Recommendations for follow up therapy are one component of a multi-disciplinary discharge planning process, led by the attending physician.  Recommendations may be updated based on patient status, additional functional criteria and insurance authorization.    Follow Up Recommendations  Home health OT    Assistance Recommended at Discharge Intermittent Supervision/Assistance  Patient can return home with the following  A little help with walking and/or transfers;Assistance with cooking/housework;A little help with bathing/dressing/bathroom;Help with stairs or ramp for entrance   Equipment Recommendations  BSC/3in1    Recommendations for Other Services      Precautions / Restrictions Precautions Precautions: Fall;Knee Precaution Booklet Issued: Yes (comment) (spanish) Restrictions Weight Bearing Restrictions: Yes RLE Weight Bearing: Weight bearing as tolerated       Mobility Bed Mobility Overal bed mobility: Needs Assistance Bed Mobility: Supine to Sit, Sit to Supine     Supine to sit: Min assist Sit to supine: Min assist        Transfers Overall transfer level: Needs assistance Equipment used: Rolling walker (2 wheels) Transfers: Sit  to/from Stand Sit to Stand: Min guard                 Balance Overall balance assessment: Needs assistance Sitting-balance support: Feet supported Sitting balance-Leahy Scale: Good     Standing balance support: Bilateral upper extremity supported, During functional activity, Reliant on assistive device for balance Standing balance-Leahy Scale: Good                             ADL either performed or assessed with clinical judgement   ADL Overall ADL's : Needs assistance/impaired                     Lower Body Dressing: Maximal assistance   Toilet Transfer: Min guard;Rolling walker (2 wheels) Toilet Transfer Details (indicate cue type and reason): simulated with RW                Extremity/Trunk Assessment              Vision       Perception     Praxis      Cognition Arousal/Alertness: Awake/alert Behavior During Therapy: WFL for tasks assessed/performed Overall Cognitive Status: Within Functional Limits for tasks assessed                                          Exercises Other Exercises Other Exercises: educated re: role of OT, role of rehab, discharge recommendations, home safety, falls prevention, optimal RLE positioning    Shoulder Instructions       General Comments author reviewed importance of ther ex and stretching to prevent  lack of AROM. she states understanding. Author re-educated pt/pt's daughter on expectations for HHPT and overall plan of care going forward. both pt/pt's daughter feel confident they can safe go home with HHPT to follow.    Pertinent Vitals/ Pain       Pain Assessment Pain Assessment: Faces Faces Pain Scale: Hurts a little bit Pain Location: R knee Pain Descriptors / Indicators: Discomfort, Grimacing, Guarding, Moaning Pain Intervention(s): Limited activity within patient's tolerance, Monitored during session, Repositioned  Home Living                                           Prior Functioning/Environment              Frequency  Min 2X/week        Progress Toward Goals  OT Goals(current goals can now be found in the care plan section)  Progress towards OT goals: Progressing toward goals     Plan Discharge plan remains appropriate    Co-evaluation                 AM-PAC OT "6 Clicks" Daily Activity     Outcome Measure   Help from another person eating meals?: None Help from another person taking care of personal grooming?: None Help from another person toileting, which includes using toliet, bedpan, or urinal?: A Little Help from another person bathing (including washing, rinsing, drying)?: A Little Help from another person to put on and taking off regular upper body clothing?: A Little Help from another person to put on and taking off regular lower body clothing?: A Little 6 Click Score: 20    End of Session Equipment Utilized During Treatment: Rolling walker (2 wheels)  OT Visit Diagnosis: Unsteadiness on feet (R26.81);Muscle weakness (generalized) (M62.81)   Activity Tolerance Patient tolerated treatment well   Patient Left in bed;with call bell/phone within reach;with bed alarm set;with family/visitor present   Nurse Communication Mobility status        Time: 5300-5110 OT Time Calculation (min): 21 min  Charges: OT General Charges $OT Visit: 1 Visit OT Treatments $Self Care/Home Management : 8-22 mins  Shanon Payor, OTD OTR/L  01/19/22, 1:22 PM

## 2022-01-19 NOTE — Plan of Care (Signed)

## 2022-01-19 NOTE — Progress Notes (Signed)
Physical Therapy Treatment Patient Details Name: Martha [name] MRN: [id number] DOB: [birth year] Today's Date: 01/19/2022   History of Present Illness Patient is a 66 year old female s/p R TKA.    PT Comments    Pt was long sitting in bed upon arriving. She is A and O x 4 with supportive daughter at bedside. Pt will have assistance at DC from daughter. Endorsing feeling better today than previous day. She demonstrated improved ability to exit bed, stand, and ambulate without LOB. Gait belt issued to pt/daughter for additional safety for home use. Pt was able to ambulate to BR and then into hallway prior to performance of stairs. Safely ascend/descend FOS with BUE support + step to pattern. Pt has cleared acute PT goals to safely DC home this afternoon. Author will return for one more session prior to DC as requested. Recommend HHPT at DC to continue to maximize pt's independence with ADLs.    Recommendations for follow up therapy are one component of a multi-disciplinary discharge planning process, led by the attending physician.  Recommendations may be updated based on patient status, additional functional criteria and insurance authorization.  Follow Up Recommendations  Home health PT     Assistance Recommended at Discharge Frequent or constant Supervision/Assistance  Patient can return home with the following A little help with walking and/or transfers;A little help with bathing/dressing/bathroom;Assistance with cooking/housework;Assist for transportation;Help with stairs or ramp for entrance   Equipment Recommendations  None recommended by PT (pt has recieved all equipment to hospital room already.)       Precautions / Restrictions Precautions Precautions: Fall;Knee Precaution Booklet Issued: No Restrictions Weight Bearing Restrictions: Yes RLE Weight Bearing: Weight bearing as tolerated     Mobility  Bed Mobility Overal bed mobility: Needs Assistance Bed Mobility:  Supine to Sit, Sit to Supine     Supine to sit: Min assist     General bed mobility comments: Min assist required to exit L side of bed. Daughter provided assistance    Transfers Overall transfer level: Needs assistance Equipment used: Rolling walker (2 wheels) Transfers: Sit to/from Stand Sit to Stand: Min guard           General transfer comment: CGA with vcs for handplacement. performed with daughters assistance. gait belt used and given to pt for home use at DC.    Ambulation/Gait Ambulation/Gait assistance: Supervision Gait Distance (Feet): 50 Feet Assistive device: Rolling walker (2 wheels) Gait Pattern/deviations: Step-to pattern, Ataxic, Trunk flexed Gait velocity: decrease     General Gait Details: pt was able to ambulate to BR then ~ 50 ft with RW prior to stair performance. supervision + vcs for posture correction and overall improved technique. no LOB however slow cadence observed.   Stairs Stairs: Yes Stairs assistance: Min guard Stair Management: Two rails, Step to pattern, Forwards Number of Stairs: 4 General stair comments: Pt was able to ascend/descend 4 stair with BUE support + step to pattern. NO LOB noted. Both daughter and patient feel safe with stairs.    Balance Overall balance assessment: Needs assistance Sitting-balance support: Feet supported Sitting balance-Leahy Scale: Good     Standing balance support: Bilateral upper extremity supported, During functional activity, Reliant on assistive device for balance Standing balance-Leahy Scale: Good       Cognition Arousal/Alertness: Awake/alert Behavior During Therapy: WFL for tasks assessed/performed Overall Cognitive Status: Within Functional Limits for tasks assessed      General Comments: Pt is A and O x 4  General Comments General comments (skin integrity, edema, etc.): author reviewed HEP/wound vac/ and polar care. re-educated on importance of keep knee in extension at  all times not up and ambulating.      Pertinent Vitals/Pain Pain Assessment Pain Assessment: 0-10 Pain Score: 6  Faces Pain Scale: Hurts a little bit Pain Location: R knee Pain Descriptors / Indicators: Discomfort, Grimacing, Guarding, Moaning Pain Intervention(s): Limited activity within patient's tolerance, Monitored during session, Premedicated before session, Ice applied     PT Goals (current goals can now be found in the care plan section) Acute Rehab PT Goals Patient Stated Goal: none stated Progress towards PT goals: Progressing toward goals    Frequency    BID      PT Plan Current plan remains appropriate       AM-PAC PT "6 Clicks" Mobility   Outcome Measure  Help needed turning from your back to your side while in a flat bed without using bedrails?: A Little Help needed moving from lying on your back to sitting on the side of a flat bed without using bedrails?: A Little Help needed moving to and from a bed to a chair (including a wheelchair)?: A Little Help needed standing up from a chair using your arms (e.g., wheelchair or bedside chair)?: A Little Help needed to walk in hospital room?: A Little Help needed climbing 3-5 steps with a railing? : A Little 6 Click Score: 18    End of Session Equipment Utilized During Treatment: Gait belt Activity Tolerance: Patient tolerated treatment well Patient left: in chair;with call bell/phone within reach;with chair alarm set;with family/visitor present Nurse Communication: Mobility status PT Visit Diagnosis: Unsteadiness on feet (R26.81);Other abnormalities of gait and mobility (R26.89);Muscle weakness (generalized) (M62.81);Difficulty in walking, not elsewhere classified (R26.2);Pain Pain - Right/Left: Right Pain - part of body: Knee     Time: 0752-0835 PT Time Calculation (min) (ACUTE ONLY): 43 min  Charges:  $Gait Training: 8-22 mins $Therapeutic Activity: 8-22 mins                    Julaine Fusi  PTA 01/19/22, 8:54 AM

## 2022-01-22 ENCOUNTER — Encounter: Payer: Medicare HMO | Attending: Physician Assistant | Admitting: Physician Assistant

## 2022-01-22 DIAGNOSIS — M170 Bilateral primary osteoarthritis of knee: Secondary | ICD-10-CM | POA: Diagnosis not present

## 2022-01-22 DIAGNOSIS — I87312 Chronic venous hypertension (idiopathic) with ulcer of left lower extremity: Secondary | ICD-10-CM | POA: Insufficient documentation

## 2022-01-22 DIAGNOSIS — I872 Venous insufficiency (chronic) (peripheral): Secondary | ICD-10-CM | POA: Insufficient documentation

## 2022-01-22 DIAGNOSIS — I10 Essential (primary) hypertension: Secondary | ICD-10-CM | POA: Diagnosis not present

## 2022-01-22 DIAGNOSIS — E11622 Type 2 diabetes mellitus with other skin ulcer: Secondary | ICD-10-CM | POA: Diagnosis present

## 2022-01-22 DIAGNOSIS — L97822 Non-pressure chronic ulcer of other part of left lower leg with fat layer exposed: Secondary | ICD-10-CM | POA: Insufficient documentation

## 2022-01-22 NOTE — Progress Notes (Addendum)
Martha, [name] ([id number]) Visit Report for 01/22/2022 Arrival Information Details Patient Name: Martha [name], Martha [name] Date of Service: 01/22/2022 3:45 PM Medical Record Number: [id number] Patient Account Number: [id number] Date of Birth/Sex: [birth year] (66 y.o. F) Treating RN: Sharyn Creamer Primary Care Malajah Oceguera: Denton Lank Other Clinician: Referring Malavika Lira: Denton Lank Treating Angeleah Labrake/Extender: Skipper Cliche in Treatment: 4 Visit Information History Since Last Visit Added or deleted any medications: Yes Patient Arrived: Ambulatory Any new allergies or adverse reactions: No Arrival Time: 15:51 Had a fall or experienced change in No Accompanied By: DAUGHTER activities of daily living that may affect Transfer Assistance: None risk of falls: Patient Identification Verified: Yes Signs or symptoms of abuse/neglect since last visito No Secondary Verification Process Completed: Yes Hospitalized since last visit: Yes Implantable device outside of the clinic excluding No cellular tissue based products placed in the center since last visit: Has Dressing in Place as Prescribed: Yes Has Compression in Place as Prescribed: Yes Pain Present Now: No Electronic Signature(s) Signed: 01/22/2022 4:57:17 PM By: Sharyn Creamer RN, BSN Entered By: Sharyn Creamer on 01/22/2022 15:56:06 Martha [name] ([id number]) -------------------------------------------------------------------------------- Compression Therapy Details Patient Name: Martha [name] Date of Service: 01/22/2022 3:45 PM Medical Record Number: [id number] Patient Account Number: [id number] Date of Birth/Sex: [birth year] (66 y.o. F) Treating RN: Sharyn Creamer Primary Care Moon Budde: Denton Lank Other Clinician: Referring Oluwatosin Bracy: Denton Lank Treating Sahian Kerney/Extender: Jeri Cos Weeks in Treatment: 4 Compression Therapy Performed for Wound Assessment: Wound #4 Left,Lateral Lower Leg Performed By:  Clinician Sharyn Creamer, RN Compression Type: Three Layer Post Procedure Diagnosis Same as Pre-procedure Electronic Signature(s) Signed: 01/22/2022 4:57:17 PM By: Sharyn Creamer RN, BSN Entered By: Sharyn Creamer on 01/22/2022 16:14:36 Martha [name] ([id number]) -------------------------------------------------------------------------------- Encounter Discharge Information Details Patient Name: Martha [name] Date of Service: 01/22/2022 3:45 PM Medical Record Number: [id number] Patient Account Number: [id number] Date of Birth/Sex: 1955/12/27 (66 y.o. F) Treating RN: Sharyn Creamer Primary Care Harrington Jobe: Denton Lank Other Clinician: Referring Gussie Towson: Denton Lank Treating Karris Deangelo/Extender: Skipper Cliche in Treatment: 4 Encounter Discharge Information Items Discharge Condition: Stable Ambulatory Status: Wheelchair Discharge Destination: Home Transportation: Private Auto Accompanied By: daughter Schedule Follow-up Appointment: Yes Clinical Summary of Care: Patient Declined Electronic Signature(s) Signed: 01/22/2022 4:57:17 PM By: Sharyn Creamer RN, BSN Entered By: Sharyn Creamer on 01/22/2022 16:16:01 Martha [name] ([id number]) -------------------------------------------------------------------------------- Lower Extremity Assessment Details Patient Name: Martha [name] Date of Service: 01/22/2022 3:45 PM Medical Record Number: [id number] Patient Account Number: [id number] Date of Birth/Sex: 1956/02/13 (66 y.o. F) Treating RN: Sharyn Creamer Primary Care Derryck Shahan: Denton Lank Other Clinician: Referring Jeni Duling: Denton Lank Treating Cara Aguino/Extender: Jeri Cos Weeks in Treatment: 4 Edema Assessment Assessed: Shirlyn Goltz: No] [Right: No] [Left: Edema] [Right: :] Calf Left: Right: Point of Measurement: From Medial Instep 35.3 cm Ankle Left: Right: Point of Measurement: From Medial Instep 20.5 cm Vascular Assessment Pulses: Dorsalis  Pedis Palpable: [Left:Yes] Electronic Signature(s) Signed: 01/22/2022 4:57:17 PM By: Sharyn Creamer RN, BSN Entered By: Sharyn Creamer on 01/22/2022 16:06:44 Martha [name] ([id number]) -------------------------------------------------------------------------------- Multi Wound Chart Details Patient Name: Martha [name] Date of Service: 01/22/2022 3:45 PM Medical Record Number: [id number] Patient Account Number: [id number] Date of Birth/Sex: 1955-10-23 (66 y.o. F) Treating RN: Sharyn Creamer Primary Care Cregg Jutte: Denton Lank Other Clinician: Referring Cathie Bonnell: Denton Lank Treating Eiden Bagot/Extender: Jeri Cos Weeks in Treatment: 4 Vital Signs Height(in): 64 Pulse(bpm): 91 Weight(lbs): 236 Blood Pressure(mmHg): 147/82 Body Mass Index(BMI): 40.5 Temperature(F): 97.9 Respiratory Rate(breaths/min): 18 Photos: [N/A:N/A] Wound Location: Left, Lateral Lower Leg N/A N/A Wounding Event: Gradually Appeared N/A N/A Primary Etiology: Venous Leg  Ulcer N/A N/A Comorbid History: Hypertension, Type II Diabetes, N/A N/A Osteoarthritis, Neuropathy Date Acquired: 08/19/2021 N/A N/A Weeks of Treatment: 4 N/A N/A Wound Status: Open N/A N/A Wound Recurrence: No N/A N/A Measurements L x W x D (cm) 0.8x0.6x0.2 N/A N/A Area (cm) : 0.377 N/A N/A Volume (cm) : 0.075 N/A N/A % Reduction in Area: 46.70% N/A N/A % Reduction in Volume: 46.80% N/A N/A Classification: Full Thickness Without Exposed N/A N/A Support Structures Exudate Amount: Medium N/A N/A Exudate Type: Serosanguineous N/A N/A Exudate Color: red, brown N/A N/A Granulation Amount: Large (67-100%) N/A N/A Granulation Quality: Red N/A N/A Necrotic Amount: None Present (0%) N/A N/A Exposed Structures: Fat Layer (Subcutaneous Tissue): N/A N/A Yes Epithelialization: Small (1-33%) N/A N/A Treatment Notes Electronic Signature(s) Signed: 01/22/2022 4:57:17 PM By: Sharyn Creamer RN, BSN Entered By: Sharyn Creamer on  01/22/2022 16:12:29 Martha [name] ([id number]) -------------------------------------------------------------------------------- Multi-Disciplinary Care Plan Details Patient Name: Martha [name] Date of Service: 01/22/2022 3:45 PM Medical Record Number: [id number] Patient Account Number: [id number] Date of Birth/Sex: 09-Nov-1955 (66 y.o. F) Treating RN: Sharyn Creamer Primary Care Halimah Bewick: Denton Lank Other Clinician: Referring Jolene Guyett: Denton Lank Treating Kassey Laforest/Extender: Skipper Cliche in Treatment: 4 Active Inactive Pain, Acute or Chronic Nursing Diagnoses: Pain Management - Cyclic Acute (Dressing Change Related) Pain Management - Non-cyclic Acute (Procedural) Pain Management - Non-cyclic Chronic Pain Pain, acute or chronic: actual or potential Potential alteration in comfort, pain Goals: Patient will verbalize adequate pain control and receive pain control interventions during procedures as needed Date Initiated: 12/25/2021 Date Inactivated: 01/08/2022 Target Resolution Date: 01/01/2022 Goal Status: Met Patient/caregiver will verbalize adequate pain control between visits Date Initiated: 12/25/2021 Target Resolution Date: 01/01/2022 Goal Status: Active Patient/caregiver will verbalize comfort level met Date Initiated: 12/25/2021 Target Resolution Date: 01/01/2022 Goal Status: Active Interventions: Assess comfort goal upon admission Complete pain assessment as per visit requirements Encourage patient to take pain medications as prescribed Implement pain control techniques (non-pharmaceutical) Provide education on pain management Provision of support: recognize patient pain, provide comfort and support as needed Reposition patient for comfort Treatment Activities: Refer to pain specialist or other pain support program : 12/25/2021 Notes: Wound/Skin Impairment Nursing Diagnoses: Impaired tissue integrity Knowledge deficit related to ulceration/compromised  skin integrity Goals: Ulcer/skin breakdown will have a volume reduction of 30% by week 4 Date Initiated: 12/25/2021 Target Resolution Date: 01/22/2022 Goal Status: Active Ulcer/skin breakdown will have a volume reduction of 50% by week 8 Date Initiated: 12/25/2021 Target Resolution Date: 02/19/2022 Goal Status: Active Ulcer/skin breakdown will have a volume reduction of 80% by week 12 Date Initiated: 12/25/2021 Target Resolution Date: 03/19/2022 Goal Status: Active Ulcer/skin breakdown will heal within 14 weeks Date Initiated: 12/25/2021 Target Resolution Date: 04/02/2022 JUDIETH, MCKOWN ([id number]) Goal Status: Active Interventions: Assess patient/caregiver ability to obtain necessary supplies Assess patient/caregiver ability to perform ulcer/skin care regimen upon admission and as needed Assess ulceration(s) every visit Provide education on ulcer and skin care Notes: Electronic Signature(s) Signed: 01/22/2022 4:57:17 PM By: Sharyn Creamer RN, BSN Entered By: Sharyn Creamer on 01/22/2022 16:12:08 Martha [name] ([id number]) -------------------------------------------------------------------------------- Pain Assessment Details Patient Name: Martha [name] Date of Service: 01/22/2022 3:45 PM Medical Record Number: [id number] Patient Account Number: [id number] Date of Birth/Sex: [birth year] (66 y.o. F) Treating RN: Sharyn Creamer Primary Care Myosha Cuadras: Denton Lank Other Clinician: Referring Mliss Wedin: Denton Lank Treating Afshin Chrystal/Extender: Jeri Cos Weeks in Treatment: 4 Active Problems Location of Pain Severity and Description of Pain Patient Has Paino No Site Locations Pain Management and Medication Current Pain Management: Electronic  Signature(s) Signed: 01/22/2022 4:57:17 PM By: Sharyn Creamer RN, BSN Entered By: Sharyn Creamer on 01/22/2022 16:08:16 Martha [name]  ([id number]) -------------------------------------------------------------------------------- Patient/Caregiver Education Details Patient Name: Martha [name] Date of Service: 01/22/2022 3:45 PM Medical Record Number: [id number] Patient Account Number: [id number] Date of Birth/Gender: November 19, 1955 (66 y.o. F) Treating RN: Sharyn Creamer Primary Care Physician: Denton Lank Other Clinician: Referring Physician: Denton Lank Treating Physician/Extender: Skipper Cliche in Treatment: 4 Education Assessment Education Provided To: Patient Education Topics Provided Wound/Skin Impairment: Methods: Explain/Verbal Responses: State content correctly Electronic Signature(s) Signed: 01/22/2022 4:57:17 PM By: Sharyn Creamer RN, BSN Entered By: Sharyn Creamer on 01/22/2022 16:13:49 Martha [name] ([id number]) -------------------------------------------------------------------------------- Wound Assessment Details Patient Name: Martha [name] Date of Service: 01/22/2022 3:45 PM Medical Record Number: [id number] Patient Account Number: [id number] Date of Birth/Sex: 02-14-1956 (66 y.o. F) Treating RN: Sharyn Creamer Primary Care Dealie Koelzer: Denton Lank Other Clinician: Referring Prabhjot Piscitello: Denton Lank Treating Denaja Verhoeven/Extender: Jeri Cos Weeks in Treatment: 4 Wound Status Wound Number: 4 Primary Etiology: Venous Leg Ulcer Wound Location: Left, Lateral Lower Leg Wound Status: Open Wounding Event: Gradually Appeared Comorbid Hypertension, Type II Diabetes, Osteoarthritis, History: Neuropathy Date Acquired: 08/19/2021 Weeks Of Treatment: 4 Clustered Wound: No Photos Wound Measurements Length: (cm) 0.8 Width: (cm) 0.6 Depth: (cm) 0.2 Area: (cm) 0.377 Volume: (cm) 0.075 % Reduction in Area: 46.7% % Reduction in Volume: 46.8% Epithelialization: Small (1-33%) Tunneling: No Undermining: No Wound Description Classification: Full Thickness Without Exposed Support  Structures Exudate Amount: Medium Exudate Type: Serosanguineous Exudate Color: red, brown Foul Odor After Cleansing: No Slough/Fibrino Yes Wound Bed Granulation Amount: Large (67-100%) Exposed Structure Granulation Quality: Red Fat Layer (Subcutaneous Tissue) Exposed: Yes Necrotic Amount: None Present (0%) Treatment Notes Wound #4 (Lower Leg) Wound Laterality: Left, Lateral Cleanser Normal Saline Discharge Instruction: Wash your hands with soap and water. Remove old dressing, discard into plastic bag and place into trash. Cleanse the wound with Normal Saline prior to applying a clean dressing using gauze sponges, not tissues or cotton balls. Do not scrub or use excessive force. Pat dry using gauze sponges, not tissue or cotton balls. Soap and Water Discharge Instruction: Gently cleanse wound with antibacterial soap, rinse and pat dry prior to dressing wounds Peri-Wound Care ZOEYA, GRAMAJO ([id number]) Topical Primary Dressing Prisma 4.34 (in) Discharge Instruction: Moisten w/normal saline or sterile water; Cover wound as directed. Do not remove from wound bed. Secondary Dressing (NON-BORDER) Zetuvit Plus Silicone NON-BORDER 5x5 (in/in) Discharge Instruction: Please do not put silicone bordered dressings under wraps. Use non-bordered dressing only under wraps. Secured With Compression Wrap 3-LAYER WRAP - Profore Lite LF 3 Multilayer Compression Bandaging System Discharge Instruction: Apply 3 multi-layer wrap as prescribed. Compression Stockings Add-Ons Electronic Signature(s) Signed: 01/22/2022 4:57:17 PM By: Sharyn Creamer RN, BSN Entered By: Sharyn Creamer on 01/22/2022 16:08:07 Martha [name] ([id number]) -------------------------------------------------------------------------------- [place] Details Patient Name: Martha [name] Date of Service: 01/22/2022 3:45 PM Medical Record Number: [id number] Patient Account Number: [id number] Date of Birth/Sex:  01/18/1956 (66 y.o. F) Treating RN: Sharyn Creamer Primary Care Evelise Reine: Denton Lank Other Clinician: Referring Carlye Panameno: Denton Lank Treating Billye Nydam/Extender: Jeri Cos Weeks in Treatment: 4 Vital Signs Time Taken: 16:00 Temperature (F): 97.9 Height (in): 64 Pulse (bpm): 91 Weight (lbs): 236 Respiratory Rate (breaths/min): 18 Body Mass Index (BMI): 40.5 Blood Pressure (mmHg): 147/82 Reference Range: 80 - 120 mg / dl Electronic Signature(s) Signed: 01/22/2022 4:57:17 PM By: Sharyn Creamer RN, BSN Entered By: Sharyn Creamer on 01/22/2022 16:01:29

## 2022-01-22 NOTE — Progress Notes (Signed)
Martha [name], Martha [name] ([id number]) Visit Report for 01/22/2022 Chief Complaint Document Details Patient Name: Martha [name], Martha [name] Date of Service: 01/22/2022 3:45 PM Medical Record Number: [id number] Patient Account Number: [id number] Date of Birth/Sex: 09-Dec-1955 (66 y.o. F) Treating RN: Primary Care Provider: Denton Lank Other Clinician: Referring Provider: Denton Lank Treating Provider/Extender: Skipper Cliche in Treatment: 4 Information Obtained from: Patient Chief Complaint Left LE Ulcer Electronic Signature(s) Signed: 01/22/2022 3:31:38 PM By: Worthy Keeler PA-C Entered By: Worthy Keeler on 01/22/2022 15:31:38 Martha [name] ([id number]) -------------------------------------------------------------------------------- Physician Orders Details Patient Name: Martha [name] Date of Service: 01/22/2022 3:45 PM Medical Record Number: [id number] Patient Account Number: [id number] Date of Birth/Sex: Jan 19, 1956 (66 y.o. F) Treating RN: Sharyn Creamer Primary Care Provider: Denton Lank Other Clinician: Referring Provider: Denton Lank Treating Provider/Extender: Skipper Cliche in Treatment: 4 Verbal / Phone Orders: No Diagnosis Coding ICD-10 Coding Code Description E11.622 Type 2 diabetes mellitus with other skin ulcer L97.822 Non-pressure chronic ulcer of other part of left lower leg with fat layer exposed I87.312 Chronic venous hypertension (idiopathic) with ulcer of left lower extremity I10 Essential (primary) hypertension Follow-up Appointments o Return Appointment in 1 week. o Nurse Visit as needed Bathing/ Shower/ Hygiene o May shower with wound dressing protected with water repellent cover or cast protector. o No tub bath. Edema Control - Lymphedema / Segmental Compressive Device / Other o Elevate, Exercise Daily and Avoid Standing for Long Periods of Time. o Elevate leg(s) parallel to the floor when sitting. o DO YOUR BEST to  sleep in the bed at night. DO NOT sleep in your recliner. Long hours of sitting in a recliner leads to swelling of the legs and/or potential wounds on your backside. Wound Treatment Wound #4 - Lower Leg Wound Laterality: Left, Lateral Cleanser: Normal Saline 1 x Per Week/30 Days Discharge Instructions: Wash your hands with soap and water. Remove old dressing, discard into plastic bag and place into trash. Cleanse the wound with Normal Saline prior to applying a clean dressing using gauze sponges, not tissues or cotton balls. Do not scrub or use excessive force. Pat dry using gauze sponges, not tissue or cotton balls. Cleanser: Soap and Water 1 x Per Week/30 Days Discharge Instructions: Gently cleanse wound with antibacterial soap, rinse and pat dry prior to dressing wounds Primary Dressing: Prisma 4.34 (in) 1 x Per Week/30 Days Discharge Instructions: Moisten w/normal saline or sterile water; Cover wound as directed. Do not remove from wound bed. Secondary Dressing: (NON-BORDER) Zetuvit Plus Silicone NON-BORDER 5x5 (in/in) 1 x Per Week/30 Days Discharge Instructions: Please do not put silicone bordered dressings under wraps. Use non-bordered dressing only under wraps. Compression Wrap: 3-LAYER WRAP - Profore Lite LF 3 Multilayer Compression Bandaging System 1 x Per Week/30 Days Discharge Instructions: Apply 3 multi-layer wrap as prescribed. Electronic Signature(s) Signed: 01/22/2022 4:57:17 PM By: Sharyn Creamer RN, BSN Entered By: Sharyn Creamer on 01/22/2022 16:13:26 Martha [name] ([id number]) -------------------------------------------------------------------------------- Problem List Details Patient Name: Martha [name] Date of Service: 01/22/2022 3:45 PM Medical Record Number: [id number] Patient Account Number: [id number] Date of Birth/Sex: 02-Jul-1956 (66 y.o. F) Treating RN: Primary Care Provider: Denton Lank Other Clinician: Referring Provider: Denton Lank Treating  Provider/Extender: Skipper Cliche in Treatment: 4 Active Problems ICD-10 Encounter Code Description Active Date MDM Diagnosis E11.622 Type 2 diabetes mellitus with other skin ulcer 12/25/2021 No Yes L97.822 Non-pressure chronic ulcer of other part of left lower leg with fat layer 12/25/2021 No Yes exposed I87.312 Chronic venous hypertension (idiopathic) with ulcer of left lower  12/25/2021 No Yes extremity I10 Essential (primary) hypertension 12/25/2021 No Yes Inactive Problems Resolved Problems Electronic Signature(s) Signed: 01/22/2022 3:31:33 PM By: Worthy Keeler PA-C Entered By: Worthy Keeler on 01/22/2022 15:31:33 Martha [name] ([id number]) -------------------------------------------------------------------------------- [place] Details Patient Name: Martha [name] Date of Service: 01/22/2022 Medical Record Number: [id number] Patient Account Number: [id number] Date of Birth/Sex: 1956-01-06 (66 y.o. F) Treating RN: Sharyn Creamer Primary Care Provider: Denton Lank Other Clinician: Referring Provider: Denton Lank Treating Provider/Extender: Jeri Cos Weeks in Treatment: 4 Diagnosis Coding ICD-10 Codes Code Description [phone] Type 2 diabetes mellitus with other skin ulcer L97.822 Non-pressure chronic ulcer of other part of left lower leg with fat layer exposed I87.312 Chronic venous hypertension (idiopathic) with ulcer of left lower extremity I10 Essential (primary) hypertension Facility Procedures CPT4 Code: [id number] Description: (Facility Use Only) [place] LEG Modifier: Quantity: 1 Electronic Signature(s) Signed: 01/22/2022 4:57:17 PM By: Sharyn Creamer RN, BSN Entered By: Sharyn Creamer on 01/22/2022 16:49:39

## 2022-01-29 ENCOUNTER — Encounter: Payer: Medicare HMO | Admitting: Internal Medicine

## 2022-01-29 DIAGNOSIS — E11622 Type 2 diabetes mellitus with other skin ulcer: Secondary | ICD-10-CM | POA: Diagnosis not present

## 2022-01-30 NOTE — Progress Notes (Signed)
CLEOPATRA, SARDO ([id number]) Visit Report for 01/29/2022 Arrival Information Details Patient Name: Martha [name], Martha [name] Date of Service: 01/29/2022 3:00 PM Medical Record Number: [id number] Patient Account Number: [id number] Date of Birth/Sex: 15-May-1956 (66 y.o. F) Treating RN: Levora Dredge Primary Care Brinkley Peet: Denton Lank Other Clinician: Massie Kluver Referring Anabia Weatherwax: Denton Lank Treating Demeka Sutter/Extender: Tito Dine in Treatment: 5 Visit Information History Since Last Visit Added or deleted any medications: No Patient Arrived: Ambulatory Any new allergies or adverse reactions: No Arrival Time: 15:06 Had a fall or experienced change in No Accompanied By: daughter/translator activities of daily living that may affect Transfer Assistance: EasyPivot Patient Lift risk of falls: Patient Identification Verified: Yes Hospitalized since last visit: Yes Secondary Verification Process Completed: Yes Has Dressing in Place as Prescribed: Yes Has Compression in Place as Prescribed: Yes Pain Present Now: No Electronic Signature(s) Signed: 01/29/2022 4:10:46 PM By: Levora Dredge Entered By: Levora Dredge on 01/29/2022 15:07:18 [name] ([id number]) -------------------------------------------------------------------------------- Clinic Level of Care Assessment Details Patient Name: [name] Date of Service: 01/29/2022 3:00 PM Medical Record Number: [id number] Patient Account Number: [id number] Date of Birth/Sex: [birth year] (66 y.o. F) Treating RN: Levora Dredge Primary Care Cynthya Yam: Denton Lank Other Clinician: Referring Kem Parcher: Denton Lank Treating Erik Nessel/Extender: Tito Dine in Treatment: 5 Clinic Level of Care Assessment Items TOOL 1 Quantity Score []  - Use when EandM and Procedure is performed on INITIAL visit 0 ASSESSMENTS - Nursing Assessment / Reassessment []  - General Physical Exam (combine w/  comprehensive assessment (listed just below) when performed on new 0 pt. evals) []  - 0 Comprehensive Assessment (HX, ROS, Risk Assessments, Wounds Hx, etc.) ASSESSMENTS - Wound and Skin Assessment / Reassessment []  - Dermatologic / Skin Assessment (not related to wound area) 0 ASSESSMENTS - Ostomy and/or Continence Assessment and Care []  - Incontinence Assessment and Management 0 []  - 0 Ostomy Care Assessment and Management (repouching, etc.) PROCESS - Coordination of Care []  - Simple Patient / Family Education for ongoing care 0 []  - 0 Complex (extensive) Patient / Family Education for ongoing care []  - 0 Staff obtains Programmer, systems, Records, Test Results / Process Orders []  - 0 Staff telephones HHA, Nursing Homes / Clarify orders / etc []  - 0 Routine Transfer to another Facility (non-emergent condition) []  - 0 Routine Hospital Admission (non-emergent condition) []  - 0 New Admissions / Biomedical engineer / Ordering NPWT, Apligraf, etc. []  - 0 Emergency Hospital Admission (emergent condition) PROCESS - Special Needs []  - Pediatric / Minor Patient Management 0 []  - 0 Isolation Patient Management []  - 0 Hearing / Language / Visual special needs []  - 0 Assessment of Community assistance (transportation, D/C planning, etc.) []  - 0 Additional assistance / Altered mentation []  - 0 Support Surface(s) Assessment (bed, cushion, seat, etc.) INTERVENTIONS - Miscellaneous []  - External ear exam 0 []  - 0 Patient Transfer (multiple staff / Civil Service fast streamer / Similar devices) []  - 0 Simple Staple / Suture removal (25 or less) []  - 0 Complex Staple / Suture removal (26 or more) []  - 0 Hypo/Hyperglycemic Management (do not check if billed separately) []  - 0 Ankle / Brachial Index (ABI) - do not check if billed separately Has the patient been seen at the hospital within the last three years: Yes Total Score: 0 Level Of Care: ____ [name] ([id number]) Electronic  Signature(s) Signed: 01/29/2022 4:10:46 PM By: Levora Dredge Entered By: Levora Dredge on 01/29/2022 15:44:08 [name] ([id number]) -------------------------------------------------------------------------------- Compression Therapy Details Patient Name: [name] Date of Service: 01/29/2022  3:00 PM Medical Record Number: [id number] Patient Account Number: [id number] Date of Birth/Sex: 04-01-1956 (66 y.o. F) Treating RN: Levora Dredge Primary Care Deondrae Mcgrail: Denton Lank Other Clinician: Referring Andersyn Fragoso: Denton Lank Treating Dakia Schifano/Extender: Tito Dine in Treatment: 5 Compression Therapy Performed for Wound Assessment: Wound #4 Left,Lateral Lower Leg Performed By: Clinician Levora Dredge, RN Compression Type: Three Layer Post Procedure Diagnosis Same as Pre-procedure Electronic Signature(s) Signed: 01/29/2022 3:43:24 PM By: Levora Dredge Entered By: Levora Dredge on 01/29/2022 15:43:23 [name] ([id number]) -------------------------------------------------------------------------------- Encounter Discharge Information Details Patient Name: [name] Date of Service: 01/29/2022 3:00 PM Medical Record Number: [id number] Patient Account Number: [id number] Date of Birth/Sex: 1956/06/16 (66 y.o. F) Treating RN: Levora Dredge Primary Care Izetta Sakamoto: Denton Lank Other Clinician: Referring Jama Mcmiller: Denton Lank Treating Cuca Benassi/Extender: Tito Dine in Treatment: 5 Encounter Discharge Information Items Discharge Condition: Stable Ambulatory Status: Wheelchair Discharge Destination: Home Transportation: Private Auto Accompanied By: daughter Schedule Follow-up Appointment: Yes Clinical Summary of Care: Electronic Signature(s) Signed: 01/29/2022 3:44:55 PM By: Levora Dredge Entered By: Levora Dredge on 01/29/2022 15:44:54 [name]  ([id number]) -------------------------------------------------------------------------------- Lower Extremity Assessment Details Patient Name: [name] Date of Service: 01/29/2022 3:00 PM Medical Record Number: [id number] Patient Account Number: [id number] Date of Birth/Sex: [birth year] (66 y.o. F) Treating RN: Levora Dredge Primary Care Tametha Banning: Denton Lank Other Clinician: Referring Ladon Heney: Denton Lank Treating Meriel Kelliher/Extender: Tito Dine in Treatment: 5 Edema Assessment Assessed: [Left: No] [Right: No] Edema: [Left: Ye] [Right: s] Calf Left: Right: Point of Measurement: 30 cm From Medial Instep 35 cm Ankle Left: Right: Point of Measurement: 10 cm From Medial Instep 21 cm Vascular Assessment Pulses: Dorsalis Pedis Palpable: [Left:Yes] Electronic Signature(s) Signed: 01/29/2022 4:10:46 PM By: Levora Dredge Entered By: Levora Dredge on 01/29/2022 15:17:15 [name] ([id number]) -------------------------------------------------------------------------------- Multi Wound Chart Details Patient Name: [name] Date of Service: 01/29/2022 3:00 PM Medical Record Number: [id number] Patient Account Number: [id number] Date of Birth/Sex: 1956-07-21 (66 y.o. F) Treating RN: Levora Dredge Primary Care Mireille Lacombe: Denton Lank Other Clinician: Referring Marissia Blackham: Denton Lank Treating Marilena Trevathan/Extender: Tito Dine in Treatment: 5 Vital Signs Height(in): 64 Pulse(bpm): 101 Weight(lbs): 236 Blood Pressure(mmHg): 135/83 Body Mass Index(BMI): 40.5 Temperature(F): 98.4 Respiratory Rate(breaths/min): 18 Photos: [N/A:N/A] Wound Location: Left, Lateral Lower Leg N/A N/A Wounding Event: Gradually Appeared N/A N/A Primary Etiology: Venous Leg Ulcer N/A N/A Comorbid History: Hypertension, Type II Diabetes, N/A N/A Osteoarthritis, Neuropathy Date Acquired: 08/19/2021 N/A N/A Weeks of Treatment: 5 N/A N/A Wound  Status: Open N/A N/A Wound Recurrence: No N/A N/A Measurements L x W x D (cm) 0.6x0.4x0.2 N/A N/A Area (cm) : 0.188 N/A N/A Volume (cm) : 0.038 N/A N/A % Reduction in Area: 73.40% N/A N/A % Reduction in Volume: 73.00% N/A N/A Classification: Full Thickness Without Exposed N/A N/A Support Structures Exudate Amount: Medium N/A N/A Exudate Type: Serosanguineous N/A N/A Exudate Color: red, brown N/A N/A Granulation Amount: Large (67-100%) N/A N/A Granulation Quality: Red N/A N/A Necrotic Amount: Small (1-33%) N/A N/A Exposed Structures: Fat Layer (Subcutaneous Tissue): N/A N/A Yes Epithelialization: Small (1-33%) N/A N/A Treatment Notes Electronic Signature(s) Signed: 01/29/2022 4:10:46 PM By: Levora Dredge Entered By: Levora Dredge on 01/29/2022 15:20:32 [name] ([id number]) -------------------------------------------------------------------------------- Multi-Disciplinary Care Plan Details Patient Name: [name] Date of Service: 01/29/2022 3:00 PM Medical Record Number: [id number] Patient Account Number: [id number] Date of Birth/Sex: 1955/09/04 (66 y.o. F) Treating RN: Levora Dredge Primary Care Jalisha Enneking: Denton Lank Other Clinician: Referring Sumeet Geter: Denton Lank Treating Amillion Macchia/Extender: Tito Dine in  Treatment: 5 Active Inactive Pain, Acute or Chronic Nursing Diagnoses: Pain Management - Cyclic Acute (Dressing Change Related) Pain Management - Non-cyclic Acute (Procedural) Pain Management - Non-cyclic Chronic Pain Pain, acute or chronic: actual or potential Potential alteration in comfort, pain Goals: Patient will verbalize adequate pain control and receive pain control interventions during procedures as needed Date Initiated: 12/25/2021 Date Inactivated: 01/08/2022 Target Resolution Date: 01/01/2022 Goal Status: Met Patient/caregiver will verbalize adequate pain control between visits Date Initiated: 12/25/2021 Target  Resolution Date: 01/01/2022 Goal Status: Active Patient/caregiver will verbalize comfort level met Date Initiated: 12/25/2021 Target Resolution Date: 01/01/2022 Goal Status: Active Interventions: Assess comfort goal upon admission Complete pain assessment as per visit requirements Encourage patient to take pain medications as prescribed Implement pain control techniques (non-pharmaceutical) Provide education on pain management Provision of support: recognize patient pain, provide comfort and support as needed Reposition patient for comfort Treatment Activities: Refer to pain specialist or other pain support program : 12/25/2021 Notes: Wound/Skin Impairment Nursing Diagnoses: Impaired tissue integrity Knowledge deficit related to ulceration/compromised skin integrity Goals: Ulcer/skin breakdown will have a volume reduction of 30% by week 4 Date Initiated: 12/25/2021 Target Resolution Date: 01/22/2022 Goal Status: Active Ulcer/skin breakdown will have a volume reduction of 50% by week 8 Date Initiated: 12/25/2021 Target Resolution Date: 02/19/2022 Goal Status: Active Ulcer/skin breakdown will have a volume reduction of 80% by week 12 Date Initiated: 12/25/2021 Target Resolution Date: 03/19/2022 Goal Status: Active Ulcer/skin breakdown will heal within 14 weeks Date Initiated: 12/25/2021 Target Resolution Date: 04/02/2022 ADDYSEN, LOUTH ([id number]) Goal Status: Active Interventions: Assess patient/caregiver ability to obtain necessary supplies Assess patient/caregiver ability to perform ulcer/skin care regimen upon admission and as needed Assess ulceration(s) every visit Provide education on ulcer and skin care Notes: Electronic Signature(s) Signed: 01/29/2022 4:10:46 PM By: Levora Dredge Entered By: Levora Dredge on 01/29/2022 15:20:17 [name] ([id number]) -------------------------------------------------------------------------------- Pain Assessment  Details Patient Name: [name] Date of Service: 01/29/2022 3:00 PM Medical Record Number: [id number] Patient Account Number: [id number] Date of Birth/Sex: 1956-08-09 (66 y.o. F) Treating RN: Levora Dredge Primary Care Grayden Burley: Denton Lank Other Clinician: Massie Kluver Referring Chinyere Galiano: Denton Lank Treating Nicollette Wilhelmi/Extender: Tito Dine in Treatment: 5 Active Problems Location of Pain Severity and Description of Pain Patient Has Paino Yes Site Locations Pain Management and Medication Current Pain Management: Notes pt states pain in right lower leg due to recent surgery Electronic Signature(s) Signed: 01/29/2022 4:10:46 PM By: Levora Dredge Entered By: Levora Dredge on 01/29/2022 15:08:01 [name] ([id number]) -------------------------------------------------------------------------------- Patient/Caregiver Education Details Patient Name: [name] Date of Service: 01/29/2022 3:00 PM Medical Record Number: [id number] Patient Account Number: [id number] Date of Birth/Gender: 1956/04/26 (66 y.o. F) Treating RN: Levora Dredge Primary Care Physician: Denton Lank Other Clinician: Referring Physician: Denton Lank Treating Physician/Extender: Tito Dine in Treatment: 5 Education Assessment Education Provided To: Patient Education Topics Provided Wound/Skin Impairment: Handouts: Caring for Your Ulcer Methods: Explain/Verbal Responses: State content correctly Electronic Signature(s) Signed: 01/29/2022 4:10:46 PM By: Levora Dredge Entered By: Levora Dredge on 01/29/2022 15:44:27 [name] ([id number]) -------------------------------------------------------------------------------- Wound Assessment Details Patient Name: [name] Date of Service: 01/29/2022 3:00 PM Medical Record Number: [id number] Patient Account Number: [id number] Date of Birth/Sex: 09/19/1955 (66 y.o.  F) Treating RN: Levora Dredge Primary Care Ronnette Rump: Denton Lank Other Clinician: Massie Kluver Referring Verdun Rackley: Denton Lank Treating Elmon Shader/Extender: Tito Dine in Treatment: 5 Wound Status Wound Number: 4 Primary Etiology: Venous Leg Ulcer Wound Location: Left, Lateral Lower Leg Wound Status: Open Wounding  Event: Gradually Appeared Comorbid Hypertension, Type II Diabetes, Osteoarthritis, History: Neuropathy Date Acquired: 08/19/2021 Weeks Of Treatment: 5 Clustered Wound: No Photos Wound Measurements Length: (cm) 0.6 Width: (cm) 0.4 Depth: (cm) 0.2 Area: (cm) 0.188 Volume: (cm) 0.038 % Reduction in Area: 73.4% % Reduction in Volume: 73% Epithelialization: Small (1-33%) Tunneling: No Undermining: No Wound Description Classification: Full Thickness Without Exposed Support Structures Exudate Amount: Medium Exudate Type: Serosanguineous Exudate Color: red, brown Foul Odor After Cleansing: No Slough/Fibrino Yes Wound Bed Granulation Amount: Large (67-100%) Exposed Structure Granulation Quality: Red Fat Layer (Subcutaneous Tissue) Exposed: Yes Necrotic Amount: Small (1-33%) Necrotic Quality: Adherent Slough Treatment Notes Wound #4 (Lower Leg) Wound Laterality: Left, Lateral Cleanser Normal Saline Discharge Instruction: Wash your hands with soap and water. Remove old dressing, discard into plastic bag and place into trash. Cleanse the wound with Normal Saline prior to applying a clean dressing using gauze sponges, not tissues or cotton balls. Do not scrub or use excessive force. Pat dry using gauze sponges, not tissue or cotton balls. Soap and Water Discharge Instruction: Gently cleanse wound with antibacterial soap, rinse and pat dry prior to dressing wounds Peri-Wound Care FRANCISCO, EYERLY ([id number]) Topical Primary Dressing Prisma 4.34 (in) Discharge Instruction: Moisten w/normal saline or sterile water; Cover wound as directed. Do  not remove from wound bed. Secondary Dressing (NON-BORDER) Zetuvit Plus Silicone NON-BORDER 5x5 (in/in) Discharge Instruction: Please do not put silicone bordered dressings under wraps. Use non-bordered dressing only under wraps. Secured With Compression Wrap 3-LAYER WRAP - Profore Lite LF 3 Multilayer Compression Bandaging System Discharge Instruction: Apply 3 multi-layer wrap as prescribed. Compression Stockings Add-Ons Electronic Signature(s) Signed: 01/29/2022 4:10:46 PM By: Levora Dredge Entered By: Levora Dredge on 01/29/2022 15:16:56 [name] ([id number]) -------------------------------------------------------------------------------- Vitals Details Patient Name: [name] Date of Service: 01/29/2022 3:00 PM Medical Record Number: [id number] Patient Account Number: [id number] Date of Birth/Sex: Feb 09, 1956 (66 y.o. F) Treating RN: Levora Dredge Primary Care Tesa Meadors: Denton Lank Other Clinician: Massie Kluver Referring Simara Rhyner: Denton Lank Treating Kein Carlberg/Extender: Tito Dine in Treatment: 5 Vital Signs Time Taken: 15:07 Temperature (F): 98.4 Height (in): 64 Pulse (bpm): 101 Weight (lbs): 236 Respiratory Rate (breaths/min): 18 Body Mass Index (BMI): 40.5 Blood Pressure (mmHg): 135/83 Reference Range: 80 - 120 mg / dl Electronic Signature(s) Signed: 01/29/2022 4:10:46 PM By: Levora Dredge Entered By: Levora Dredge on 01/29/2022 15:07:41

## 2022-01-30 NOTE — Progress Notes (Signed)
ANALEIA, ISMAEL ([id number]) Visit Report for 01/29/2022 HPI Details Patient Name: Martha [name], Martha [name] Date of Service: 01/29/2022 3:00 PM Medical Record Number: [id number] Patient Account Number: [id number] Date of Birth/Sex: 04-10-1956 (65 y.o. F) Treating RN: Levora Dredge Primary Care Provider: Denton Lank Other Clinician: Referring Provider: Denton Lank Treating Provider/Extender: Tito Dine in Treatment: 5 History of Present Illness HPI Description: 12/25/2021 upon evaluation today patient appears to be doing okay in regard to her wound on the lateral aspect of her left leg. This is something that she tells me is somewhat recurrent. She does have a history of lower extremity edema. With that being said she tells me a lot of these areas she just takes care of overall and has done well last time she was [name] here in the office was actually in 2015. She is [name] with her daughter today and with the help of an interpreter we were able to evaluate her effectively today. The patient does tell me that her most recent hemoglobin A1c was 8.0 this was last week however the provider that she sees is not in our system. She also tells me that she has been using Neosporin to dress the wound with an Dial soap to clean although occasionally if it itches she will use alcohol. With that being said I do not see any evidence of infection at this time which is great news she was placed on Bactrim DS and subsequently had what sounds to be a mild anaphylactic reaction which is not good we have added that to her list of allergies here in the clinic. Otherwise I do believe she is going to be able to effectively get this healed but she is having some issues here with an upcoming surgery for her right knee to replace that knee and then subsequently she will be doing the left knee as well following. The right knee surgery is scheduled for June 1 3 peripherally honest I am not sure we will have  this healed by then I am not sure the surgeon is and want to proceed as long she has an open wound although that will be left up to Dr. Rudene Christians who is her surgeon. The patient does have a history of hypertension, diabetes mellitus type 2, chronic venous insufficiency, and bilateral knee osteoarthritis requiring arthroplasty. 01-08-2022 upon evaluation today patient appears to be doing well with regard to her wound. In fact she is showing signs of improvement which is great news this is measuring smaller. She has talked to her surgeon and they still plan to do her right knee replacement this is again opposite side of her wound and there is no signs of wound infection so I think she is probably okay in that regard. Fortunately there does not appear to be any evidence of active infection locally or systemically at this time which is great news. 01-15-2022 upon evaluation today patient appears to be doing well with regard to her wound. She has been tolerating the dressing changes without complication. Fortunately there does not appear to be any signs of active infection locally or systemically which is great news. 01-22-2022 upon evaluation today patient appears to be doing well with regard to her wounds. In fact the wound is significantly smaller which is great news and looks to be doing quite well. However her wrap was apparently a little bit tight last week she did not call and let us know she was not aware of that that was something that she should have done.  I did advise her in the future if she has any issues with the wrap to please let me know so that we can correct that. Obviously I do not want her to have issues like she did and be very uncomfortable during the interim between the sides which it can also cause problems obviously. 6/13; small wound on the left lateral lower leg. She has been using silver collagen. Slight improvement in dimensions today Electronic Signature(s) Signed: 01/30/2022 11:05:19  AM By: Linton Ham MD Entered By: Linton Ham on 01/29/2022 15:27:29 Martha [name] ([id number]) -------------------------------------------------------------------------------- Physical Exam Details Patient Name: Martha [name] Date of Service: 01/29/2022 3:00 PM Medical Record Number: [id number] Patient Account Number: [id number] Date of Birth/Sex: July 02, 1956 (66 y.o. F) Treating RN: Levora Dredge Primary Care Provider: Denton Lank Other Clinician: Referring Provider: Denton Lank Treating Provider/Extender: Tito Dine in Treatment: 5 Constitutional Sitting or standing Blood Pressure is within target range for patient.. Pulse regular and within target range for patient.Marland Kitchen Respirations regular, non- labored and within target range.. Temperature is normal and within the target range for the patient.Marland Kitchen appears in no distress. Notes Wound exam. Small wound some depth. No debridement required. Healthy looking granulation no evidence of surrounding infection. She has excellent edema control. Palpable peripheral pulses Electronic Signature(s) Signed: 01/30/2022 11:05:19 AM By: Linton Ham MD Entered By: Linton Ham on 01/29/2022 15:28:25 Martha [name] ([id number]) -------------------------------------------------------------------------------- Physician Orders Details Patient Name: Martha [name] Date of Service: 01/29/2022 3:00 PM Medical Record Number: [id number] Patient Account Number: [id number] Date of Birth/Sex: Oct 30, 1955 (66 y.o. F) Treating RN: Levora Dredge Primary Care Provider: Denton Lank Other Clinician: Referring Provider: Denton Lank Treating Provider/Extender: Tito Dine in Treatment: 5 Verbal / Phone Orders: No Diagnosis Coding ICD-10 Coding Code Description E11.622 Type 2 diabetes mellitus with other skin ulcer L97.822 Non-pressure chronic ulcer of other part of left lower leg with fat  layer exposed I87.312 Chronic venous hypertension (idiopathic) with ulcer of left lower extremity I10 Essential (primary) hypertension Follow-up Appointments o Return Appointment in 1 week. o Nurse Visit as needed Bathing/ Shower/ Hygiene o May shower with wound dressing protected with water repellent cover or cast protector. o No tub bath. Edema Control - Lymphedema / Segmental Compressive Device / Other o Elevate, Exercise Daily and Avoid Standing for Long Periods of Time. o Elevate leg(s) parallel to the floor when sitting. o DO YOUR BEST to sleep in the bed at night. DO NOT sleep in your recliner. Long hours of sitting in a recliner leads to swelling of the legs and/or potential wounds on your backside. Wound Treatment Wound #4 - Lower Leg Wound Laterality: Left, Lateral Cleanser: Normal Saline 1 x Per Week/30 Days Discharge Instructions: Wash your hands with soap and water. Remove old dressing, discard into plastic bag and place into trash. Cleanse the wound with Normal Saline prior to applying a clean dressing using gauze sponges, not tissues or cotton balls. Do not scrub or use excessive force. Pat dry using gauze sponges, not tissue or cotton balls. Cleanser: Soap and Water 1 x Per Week/30 Days Discharge Instructions: Gently cleanse wound with antibacterial soap, rinse and pat dry prior to dressing wounds Primary Dressing: Prisma 4.34 (in) 1 x Per Week/30 Days Discharge Instructions: Moisten w/normal saline or sterile water; Cover wound as directed. Do not remove from wound bed. Secondary Dressing: (NON-BORDER) Zetuvit Plus Silicone NON-BORDER 5x5 (in/in) 1 x Per Week/30 Days Discharge Instructions: Please do not put silicone bordered dressings under wraps. Use non-bordered dressing  only under wraps. Compression Wrap: 3-LAYER WRAP - Profore Lite LF 3 Multilayer Compression Bandaging System 1 x Per Week/30 Days Discharge Instructions: Apply 3 multi-layer wrap as  prescribed. Electronic Signature(s) Signed: 01/29/2022 3:43:40 PM By: Levora Dredge Signed: 01/30/2022 11:05:19 AM By: Linton Ham MD Entered By: Levora Dredge on 01/29/2022 15:43:40 Martha [name] ([id number]) -------------------------------------------------------------------------------- Problem List Details Patient Name: Martha [name] Date of Service: 01/29/2022 3:00 PM Medical Record Number: [id number] Patient Account Number: [id number] Date of Birth/Sex: 16-Sep-1955 (66 y.o. F) Treating RN: Levora Dredge Primary Care Provider: Denton Lank Other Clinician: Referring Provider: Denton Lank Treating Provider/Extender: Tito Dine in Treatment: 5 Active Problems ICD-10 Encounter Code Description Active Date MDM Diagnosis E11.622 Type 2 diabetes mellitus with other skin ulcer 12/25/2021 No Yes L97.822 Non-pressure chronic ulcer of other part of left lower leg with fat layer 12/25/2021 No Yes exposed I87.312 Chronic venous hypertension (idiopathic) with ulcer of left lower 12/25/2021 No Yes extremity I10 Essential (primary) hypertension 12/25/2021 No Yes Inactive Problems Resolved Problems Electronic Signature(s) Signed: 01/30/2022 11:05:19 AM By: Linton Ham MD Entered By: Linton Ham on 01/29/2022 15:26:39 Martha [name] ([id number]) -------------------------------------------------------------------------------- Progress Note Details Patient Name: Martha [name] Date of Service: 01/29/2022 3:00 PM Medical Record Number: [id number] Patient Account Number: [id number] Date of Birth/Sex: [birth year] (66 y.o. F) Treating RN: Levora Dredge Primary Care Provider: Denton Lank Other Clinician: Referring Provider: Denton Lank Treating Provider/Extender: Tito Dine in Treatment: 5 Subjective History of Present Illness (HPI) 12/25/2021 upon evaluation today patient appears to be doing okay in regard to her wound on  the lateral aspect of her left leg. This is something that she tells me is somewhat recurrent. She does have a history of lower extremity edema. With that being said she tells me a lot of these areas she just takes care of overall and has done well last time she was [name] here in the office was actually in 2015. She is [name] with her daughter today and with the help of an interpreter we were able to evaluate her effectively today. The patient does tell me that her most recent hemoglobin A1c was 8.0 this was last week however the provider that she sees is not in our system. She also tells me that she has been using Neosporin to dress the wound with an Dial soap to clean although occasionally if it itches she will use alcohol. With that being said I do not see any evidence of infection at this time which is great news she was placed on Bactrim DS and subsequently had what sounds to be a mild anaphylactic reaction which is not good we have added that to her list of allergies here in the clinic. Otherwise I do believe she is going to be able to effectively get this healed but she is having some issues here with an upcoming surgery for her right knee to replace that knee and then subsequently she will be doing the left knee as well following. The right knee surgery is scheduled for June 1 3 peripherally honest I am not sure we will have this healed by then I am not sure the surgeon is and want to proceed as long she has an open wound although that will be left up to Dr. Rudene Christians who is her surgeon. The patient does have a history of hypertension, diabetes mellitus type 2, chronic venous insufficiency, and bilateral knee osteoarthritis requiring arthroplasty. 01-08-2022 upon evaluation today patient appears to be doing well with regard to her  wound. In fact she is showing signs of improvement which is great news this is measuring smaller. She has talked to her surgeon and they still plan to do her right knee  replacement this is again opposite side of her wound and there is no signs of wound infection so I think she is probably okay in that regard. Fortunately there does not appear to be any evidence of active infection locally or systemically at this time which is great news. 01-15-2022 upon evaluation today patient appears to be doing well with regard to her wound. She has been tolerating the dressing changes without complication. Fortunately there does not appear to be any signs of active infection locally or systemically which is great news. 01-22-2022 upon evaluation today patient appears to be doing well with regard to her wounds. In fact the wound is significantly smaller which is great news and looks to be doing quite well. However her wrap was apparently a little bit tight last week she did not call and let us know she was not aware of that that was something that she should have done. I did advise her in the future if she has any issues with the wrap to please let me know so that we can correct that. Obviously I do not want her to have issues like she did and be very uncomfortable during the interim between the sides which it can also cause problems obviously. 6/13; small wound on the left lateral lower leg. She has been using silver collagen. Slight improvement in dimensions today Objective Constitutional Sitting or standing Blood Pressure is within target range for patient.. Pulse regular and within target range for patient.Marland Kitchen Respirations regular, non- labored and within target range.. Temperature is normal and within the target range for the patient.Marland Kitchen appears in no distress. Vitals Time Taken: 3:07 PM, Height: 64 in, Weight: 236 lbs, BMI: 40.5, Temperature: 98.4 F, Pulse: 101 bpm, Respiratory Rate: 18 breaths/min, Blood Pressure: 135/83 mmHg. General Notes: Wound exam. Small wound some depth. No debridement required. Healthy looking granulation no evidence of surrounding infection. She has  excellent edema control. Palpable peripheral pulses Integumentary (Hair, Skin) Wound #4 status is Open. Original cause of wound was Gradually Appeared. The date acquired was: 08/19/2021. The wound has been in treatment 5 weeks. The wound is located on the Left,Lateral Lower Leg. The wound measures 0.6cm length x 0.4cm width x 0.2cm depth; 0.188cm^2 area and 0.038cm^3 volume. There is Fat Layer (Subcutaneous Tissue) exposed. There is no tunneling or undermining noted. There is a medium amount of serosanguineous drainage noted. There is large (67-100%) red granulation within the wound bed. There is a small (1-33%) amount of necrotic tissue within the wound bed including Adherent Slough. [place], [place] ([id number]) Assessment Active Problems ICD-10 Type 2 diabetes mellitus with other skin ulcer Non-pressure chronic ulcer of other part of left lower leg with fat layer exposed Chronic venous hypertension (idiopathic) with ulcer of left lower extremity Essential (primary) hypertension Plan 1. The wound is slightly smaller and the surface looks better. Therefore no mechanical debridement is necessary. 2. No evidence of surrounding infection. Electronic Signature(s) Signed: 01/30/2022 11:05:19 AM By: Linton Ham MD Entered By: Linton Ham on 01/29/2022 15:29:31 Martha [name] ([id number]) -------------------------------------------------------------------------------- SuperBill Details Patient Name: Martha [name] Date of Service: 01/29/2022 Medical Record Number: [id number] Patient Account Number: [id number] Date of Birth/Sex: 03/31/1956 (66 y.o. F) Treating RN: Levora Dredge Primary Care Provider: Denton Lank Other Clinician: Referring Provider: Denton Lank Treating Provider/Extender: Tito Dine  in Treatment: 5 Diagnosis Coding ICD-10 Codes Code Description E11.622 Type 2 diabetes mellitus with other skin ulcer L97.822 Non-pressure chronic  ulcer of other part of left lower leg with fat layer exposed I87.312 Chronic venous hypertension (idiopathic) with ulcer of left lower extremity I10 Essential (primary) hypertension Facility Procedures CPT4 Code: 09198022 Description: (Facility Use Only) [phone] - [place] LWR LT LEG Modifier: Quantity: 1 Physician Procedures CPT4 Code: 5486282 Description: 41753 - WC PHYS LEVEL 3 - EST PT Modifier: Quantity: 1 CPT4 Code: Description: ICD-10 Diagnosis Description L97.822 Non-pressure chronic ulcer of other part of left lower leg with fat lay E11.622 Type 2 diabetes mellitus with other skin ulcer I87.312 Chronic venous hypertension (idiopathic) with ulcer of left lower  extre Modifier: er exposed mity Quantity: Electronic Signature(s) Signed: 01/29/2022 3:44:19 PM By: Levora Dredge Signed: 01/30/2022 11:05:19 AM By: Linton Ham MD Entered By: Levora Dredge on 01/29/2022 15:44:19

## 2022-02-05 ENCOUNTER — Encounter: Payer: Medicare HMO | Admitting: Physician Assistant

## 2022-02-05 DIAGNOSIS — E11622 Type 2 diabetes mellitus with other skin ulcer: Secondary | ICD-10-CM | POA: Diagnosis not present

## 2022-02-05 NOTE — Progress Notes (Signed)
TIYONA, DESOUZA ([id number]) Visit Report for 02/05/2022 Arrival Information Details Patient Name: Martha [name], Martha [name] Date of Service: 02/05/2022 3:00 PM Medical Record Number: [id number] Patient Account Number: [id number] Date of Birth/Sex: 1956/02/06 (66 y.o. F) Treating RN: Levora Dredge Primary Care Provider: Denton Lank Other Clinician: Massie Kluver Referring Provider: Denton Lank Treating Provider/Extender: Skipper Cliche in Treatment: 6 Visit Information History Since Last Visit Added or deleted any medications: No Patient Arrived: Gilford Rile Any new allergies or adverse reactions: No Arrival Time: 15:07 Had a fall or experienced change in No Accompanied By: daughter activities of daily living that may affect Transfer Assistance: None risk of falls: Hospitalized since last visit: No Has Dressing in Place as Prescribed: Yes Has Compression in Place as Prescribed: Yes Pain Present Now: No Electronic Signature(s) Signed: 02/05/2022 4:38:54 PM By: Levora Dredge Entered By: Levora Dredge on 02/05/2022 15:08:06 Martha [name] ([id number]) -------------------------------------------------------------------------------- Clinic Level of Care Assessment Details Patient Name: Martha [name] Date of Service: 02/05/2022 3:00 PM Medical Record Number: [id number] Patient Account Number: [id number] Date of Birth/Sex: 08-11-1956 (66 y.o. F) Treating RN: Levora Dredge Primary Care Provider: Denton Lank Other Clinician: Referring Provider: Denton Lank Treating Provider/Extender: Skipper Cliche in Treatment: 6 Clinic Level of Care Assessment Items TOOL 4 Quantity Score [] - Use when only an EandM is performed on FOLLOW-UP visit 0 ASSESSMENTS - Nursing Assessment / Reassessment X - Reassessment of Co-morbidities (includes updates in patient status) 1 10 X- 1 5 Reassessment of Adherence to Treatment Plan ASSESSMENTS - Wound and Skin Assessment /  Reassessment X - Simple Wound Assessment / Reassessment - one wound 1 5 [] - 0 Complex Wound Assessment / Reassessment - multiple wounds [] - 0 Dermatologic / Skin Assessment (not related to wound area) ASSESSMENTS - Focused Assessment X - Circumferential Edema Measurements - multi extremities 1 5 [] - 0 Nutritional Assessment / Counseling / Intervention [] - 0 Lower Extremity Assessment (monofilament, tuning fork, pulses) [] - 0 Peripheral Arterial Disease Assessment (using hand held doppler) ASSESSMENTS - Ostomy and/or Continence Assessment and Care [] - Incontinence Assessment and Management 0 [] - 0 Ostomy Care Assessment and Management (repouching, etc.) PROCESS - Coordination of Care X - Simple Patient / Family Education for ongoing care 1 15 [] - 0 Complex (extensive) Patient / Family Education for ongoing care [] - 0 Staff obtains Programmer, systems, Records, Test Results / Process Orders [] - 0 Staff telephones HHA, Nursing Homes / Clarify orders / etc [] - 0 Routine Transfer to another Facility (non-emergent condition) [] - 0 Routine Hospital Admission (non-emergent condition) [] - 0 New Admissions / Biomedical engineer / Ordering NPWT, Apligraf, etc. [] - 0 Emergency Hospital Admission (emergent condition) X- 1 10 Simple Discharge Coordination [] - 0 Complex (extensive) Discharge Coordination PROCESS - Special Needs [] - Pediatric / Minor Patient Management 0 [] - 0 Isolation Patient Management [] - 0 Hearing / Language / Visual special needs [] - 0 Assessment of Community assistance (transportation, D/C planning, etc.) [] - 0 Additional assistance / Altered mentation [] - 0 Support Surface(s) Assessment (bed, cushion, seat, etc.) INTERVENTIONS - Wound Cleansing / Measurement HENRIQUEZ-RIVERA, Narissa ([id number]) X- 1 5 Simple Wound Cleansing - one wound [] - 0 Complex Wound Cleansing - multiple wounds X- 1 5 Wound Imaging (photographs - any number of  wounds) [] - 0 Wound Tracing (instead of photographs) X- 1 5 Simple Wound Measurement - one wound [] - 0 Complex Wound Measurement - multiple wounds INTERVENTIONS - Wound  Dressings X - Small Wound Dressing one or multiple wounds 1 10 [] - 0 Medium Wound Dressing one or multiple wounds [] - 0 Large Wound Dressing one or multiple wounds [] - 0 Application of Medications - topical [] - 0 Application of Medications - injection INTERVENTIONS - Miscellaneous [] - External ear exam 0 [] - 0 Specimen Collection (cultures, biopsies, blood, body fluids, etc.) [] - 0 Specimen(s) / Culture(s) sent or taken to Lab for analysis [] - 0 Patient Transfer (multiple staff / Civil Service fast streamer / Similar devices) [] - 0 Simple Staple / Suture removal (25 or less) [] - 0 Complex Staple / Suture removal (26 or more) [] - 0 Hypo / Hyperglycemic Management (close monitor of Blood Glucose) [] - 0 Ankle / Brachial Index (ABI) - do not check if billed separately X- 1 5 Vital Signs Has the patient been [name] at the hospital within the last three years: Yes Total Score: 80 Level Of Care: New/Established - Level 3 Electronic Signature(s) Signed: 02/05/2022 4:38:54 PM By: Levora Dredge Entered By: Levora Dredge on 02/05/2022 16:22:44 Martha [name] ([id number]) -------------------------------------------------------------------------------- Encounter Discharge Information Details Patient Name: Martha [name] Date of Service: 02/05/2022 3:00 PM Medical Record Number: [id number] Patient Account Number: [id number] Date of Birth/Sex: [birth year] (66 y.o. F) Treating RN: Levora Dredge Primary Care Peola Joynt: Denton Lank Other Clinician: Referring Katryna Tschirhart: Denton Lank Treating Cindy Brindisi/Extender: Skipper Cliche in Treatment: 6 Encounter Discharge Information Items Discharge Condition: Stable Ambulatory Status: Walker Discharge Destination: Home Transportation: Private  Auto Accompanied By: daughter Schedule Follow-up Appointment: Yes Clinical Summary of Care: Electronic Signature(s) Signed: 02/05/2022 4:23:35 PM By: Levora Dredge Entered By: Levora Dredge on 02/05/2022 16:23:35 Martha [name] ([id number]) -------------------------------------------------------------------------------- Lower Extremity Assessment Details Patient Name: Martha [name] Date of Service: 02/05/2022 3:00 PM Medical Record Number: [id number] Patient Account Number: [id number] Date of Birth/Sex: July 16, 1956 (66 y.o. F) Treating RN: Levora Dredge Primary Care Briante Loveall: Denton Lank Other Clinician: Massie Kluver Referring Barri Neidlinger: Denton Lank Treating Quayshaun Hubbert/Extender: Jeri Cos Weeks in Treatment: 6 Edema Assessment Assessed: [Left: No] [Right: No] Edema: [Left: Ye] [Right: s] Calf Left: Right: Point of Measurement: 30 cm From Medial Instep 34.5 cm Ankle Left: Right: Point of Measurement: 10 cm From Medial Instep 21 cm Vascular Assessment Pulses: Dorsalis Pedis Palpable: [Left:Yes] Electronic Signature(s) Signed: 02/05/2022 4:38:54 PM By: Levora Dredge Entered By: Levora Dredge on 02/05/2022 15:19:04 Martha [name] ([id number]) -------------------------------------------------------------------------------- Multi Wound Chart Details Patient Name: Martha [name] Date of Service: 02/05/2022 3:00 PM Medical Record Number: [id number] Patient Account Number: [id number] Date of Birth/Sex: March 12, 1956 (66 y.o. F) Treating RN: Levora Dredge Primary Care Kipling Graser: Denton Lank Other Clinician: Massie Kluver Referring Zelene Barga: Denton Lank Treating Nahima Ales/Extender: Jeri Cos Weeks in Treatment: 6 Vital Signs Height(in): 64 Pulse(bpm): 114 Weight(lbs): 236 Blood Pressure(mmHg): 132/97 Body Mass Index(BMI): 40.5 Temperature(F): 97.8 Respiratory Rate(breaths/min): 18 Photos: [N/A:N/A] Wound Location: Left, Lateral  Lower Leg N/A N/A Wounding Event: Gradually Appeared N/A N/A Primary Etiology: Venous Leg Ulcer N/A N/A Comorbid History: Hypertension, Type II Diabetes, N/A N/A Osteoarthritis, Neuropathy Date Acquired: 08/19/2021 N/A N/A Weeks of Treatment: 6 N/A N/A Wound Status: Open N/A N/A Wound Recurrence: No N/A N/A Measurements L x W x D (cm) 0.1x0.1x0.1 N/A N/A Area (cm) : 0.008 N/A N/A Volume (cm) : 0.001 N/A N/A % Reduction in Area: 98.90% N/A N/A % Reduction in Volume: 99.30% N/A N/A Classification: Full Thickness Without Exposed N/A N/A Support Structures Exudate Amount: Medium N/A N/A Exudate Type: Serosanguineous N/A N/A Exudate Color: red, brown N/A  N/A Granulation Amount: Large (67-100%) N/A N/A Granulation Quality: Red, Pink N/A N/A Necrotic Amount: None Present (0%) N/A N/A Exposed Structures: Fat Layer (Subcutaneous Tissue): N/A N/A Yes Epithelialization: Small (1-33%) N/A N/A Treatment Notes Electronic Signature(s) Signed: 02/05/2022 4:38:54 PM By: Levora Dredge Entered By: Levora Dredge on 02/05/2022 15:19:18 Martha [name] ([id number]) -------------------------------------------------------------------------------- Multi-Disciplinary Care Plan Details Patient Name: Martha [name] Date of Service: 02/05/2022 3:00 PM Medical Record Number: [id number] Patient Account Number: [id number] Date of Birth/Sex: 08-30-1955 (66 y.o. F) Treating RN: Levora Dredge Primary Care Provider: Denton Lank Other Clinician: Massie Kluver Referring Provider: Denton Lank Treating Provider/Extender: Skipper Cliche in Treatment: 6 Active Inactive Pain, Acute or Chronic Nursing Diagnoses: Pain Management - Cyclic Acute (Dressing Change Related) Pain Management - Non-cyclic Acute (Procedural) Pain Management - Non-cyclic Chronic Pain Pain, acute or chronic: actual or potential Potential alteration in comfort, pain Goals: Patient will verbalize adequate pain  control and receive pain control interventions during procedures as needed Date Initiated: 12/25/2021 Date Inactivated: 01/08/2022 Target Resolution Date: 01/01/2022 Goal Status: Met Patient/caregiver will verbalize adequate pain control between visits Date Initiated: 12/25/2021 Target Resolution Date: 01/01/2022 Goal Status: Active Patient/caregiver will verbalize comfort level met Date Initiated: 12/25/2021 Target Resolution Date: 01/01/2022 Goal Status: Active Interventions: Assess comfort goal upon admission Complete pain assessment as per visit requirements Encourage patient to take pain medications as prescribed Implement pain control techniques (non-pharmaceutical) Provide education on pain management Provision of support: recognize patient pain, provide comfort and support as needed Reposition patient for comfort Treatment Activities: Refer to pain specialist or other pain support program : 12/25/2021 Notes: Wound/Skin Impairment Nursing Diagnoses: Impaired tissue integrity Knowledge deficit related to ulceration/compromised skin integrity Goals: Ulcer/skin breakdown will have a volume reduction of 30% by week 4 Date Initiated: 12/25/2021 Target Resolution Date: 01/22/2022 Goal Status: Active Ulcer/skin breakdown will have a volume reduction of 50% by week 8 Date Initiated: 12/25/2021 Target Resolution Date: 02/19/2022 Goal Status: Active Ulcer/skin breakdown will have a volume reduction of 80% by week 12 Date Initiated: 12/25/2021 Target Resolution Date: 03/19/2022 Goal Status: Active Ulcer/skin breakdown will heal within 14 weeks Date Initiated: 12/25/2021 Target Resolution Date: 04/02/2022 Martha [name], Martha [name] ([id number]) Goal Status: Active Interventions: Assess patient/caregiver ability to obtain necessary supplies Assess patient/caregiver ability to perform ulcer/skin care regimen upon admission and as needed Assess ulceration(s) every visit Provide education on ulcer and skin  care Notes: Electronic Signature(s) Signed: 02/05/2022 4:38:54 PM By: Levora Dredge Entered By: Levora Dredge on 02/05/2022 15:19:11 Martha [name] ([id number]) -------------------------------------------------------------------------------- Pain Assessment Details Patient Name: Martha [name] Date of Service: 02/05/2022 3:00 PM Medical Record Number: [id number] Patient Account Number: [id number] Date of Birth/Sex: 1956-08-13 (66 y.o. F) Treating RN: Levora Dredge Primary Care Provider: Denton Lank Other Clinician: Massie Kluver Referring Provider: Denton Lank Treating Provider/Extender: Skipper Cliche in Treatment: 6 Active Problems Location of Pain Severity and Description of Pain Patient Has Paino No Site Locations Rate the pain. Current Pain Level: 0 Pain Management and Medication Current Pain Management: Electronic Signature(s) Signed: 02/05/2022 4:38:54 PM By: Levora Dredge Entered By: Levora Dredge on 02/05/2022 15:10:40 Martha [name] ([id number]) -------------------------------------------------------------------------------- Patient/Caregiver Education Details Patient Name: Martha [name] Date of Service: 02/05/2022 3:00 PM Medical Record Number: [id number] Patient Account Number: [id number] Date of Birth/Gender: September 16, 1955 (66 y.o. F) Treating RN: Levora Dredge Primary Care Physician: Denton Lank Other Clinician: Referring Physician: Denton Lank Treating Physician/Extender: Skipper Cliche in Treatment: 6 Education Assessment Education Provided To: Patient Education Topics Provided Wound/Skin Impairment: Handouts: Caring for Your Ulcer  Methods: Explain/Verbal Responses: State content correctly Electronic Signature(s) Signed: 02/05/2022 4:38:54 PM By: Levora Dredge Entered By: Levora Dredge on 02/05/2022 16:23:02 Martha [name]  ([id number]) -------------------------------------------------------------------------------- Wound Assessment Details Patient Name: Martha [name] Date of Service: 02/05/2022 3:00 PM Medical Record Number: [id number] Patient Account Number: [id number] Date of Birth/Sex: 1955-08-30 (66 y.o. F) Treating RN: Levora Dredge Primary Care Sebrena Engh: Denton Lank Other Clinician: Massie Kluver Referring Alnisa Hasley: Denton Lank Treating Brett Darko/Extender: Jeri Cos Weeks in Treatment: 6 Wound Status Wound Number: 4 Primary Etiology: Venous Leg Ulcer Wound Location: Left, Lateral Lower Leg Wound Status: Open Wounding Event: Gradually Appeared Comorbid Hypertension, Type II Diabetes, Osteoarthritis, History: Neuropathy Date Acquired: 08/19/2021 Weeks Of Treatment: 6 Clustered Wound: No Photos Wound Measurements Length: (cm) 0.1 Width: (cm) 0.1 Depth: (cm) 0.1 Area: (cm) 0.008 Volume: (cm) 0.001 % Reduction in Area: 98.9% % Reduction in Volume: 99.3% Epithelialization: Small (1-33%) Tunneling: No Undermining: No Wound Description Classification: Full Thickness Without Exposed Support Structures Exudate Amount: Medium Exudate Type: Serosanguineous Exudate Color: red, brown Foul Odor After Cleansing: No Slough/Fibrino Yes Wound Bed Granulation Amount: Large (67-100%) Exposed Structure Granulation Quality: Red, Pink Fat Layer (Subcutaneous Tissue) Exposed: Yes Necrotic Amount: None Present (0%) Treatment Notes Wound #4 (Lower Leg) Wound Laterality: Left, Lateral Cleanser Normal Saline Discharge Instruction: Wash your hands with soap and water. Remove old dressing, discard into plastic bag and place into trash. Cleanse the wound with Normal Saline prior to applying a clean dressing using gauze sponges, not tissues or cotton balls. Do not scrub or use excessive force. Pat dry using gauze sponges, not tissue or cotton balls. Soap and Water Discharge Instruction: Gently  cleanse wound with antibacterial soap, rinse and pat dry prior to dressing wounds Peri-Wound Care Martha [name], Martha [name] ([id number]) Topical Primary Dressing Xeroform 5x9-HBD (in/in) Discharge Instruction: Apply Xeroform 5x9-HBD (in/in) as directed Secondary Dressing Coverlet Latex-Free Fabric Adhesive Dressings Discharge Instruction: 1.5 x 2 Secured With Tubigrip Size C, 2.75x10 (in/yd) Discharge Instruction: Apply 3 Tubigrip C 3-finger-widths below knee to base of toes to secure dressing and/or for swelling. Compression Wrap Compression Stockings Add-Ons Electronic Signature(s) Signed: 02/05/2022 4:38:54 PM By: Levora Dredge Entered By: Levora Dredge on 02/05/2022 15:18:45 Martha [name] ([id number]) -------------------------------------------------------------------------------- Vitals Details Patient Name: Martha [name] Date of Service: 02/05/2022 3:00 PM Medical Record Number: [id number] Patient Account Number: [id number] Date of Birth/Sex: [birth year] (66 y.o. F) Treating RN: Levora Dredge Primary Care Latressa Harries: Denton Lank Other Clinician: Massie Kluver Referring Misk Galentine: Denton Lank Treating Jenya Putz/Extender: Jeri Cos Weeks in Treatment: 6 Vital Signs Time Taken: 15:08 Temperature (F): 97.8 Height (in): 64 Pulse (bpm): 114 Weight (lbs): 236 Respiratory Rate (breaths/min): 18 Body Mass Index (BMI): 40.5 Blood Pressure (mmHg): 132/97 Reference Range: 80 - 120 mg / dl Electronic Signature(s) Signed: 02/05/2022 4:38:54 PM By: Levora Dredge Entered By: Levora Dredge on 02/05/2022 15:10:20

## 2022-02-05 NOTE — Progress Notes (Signed)
Martha [name] ([id number]) Visit Report for 02/05/2022 Chief Complaint Document Details Patient Name: Martha [name] Date of Service: 02/05/2022 3:00 PM Medical Record Number: [id number] Patient Account Number: [id number] Date of Birth/Sex: 1955/11/10 (66 y.o. F) Treating RN: Cornell Barman Primary Care Provider: Denton Lank Other Clinician: Massie Kluver Referring Provider: Denton Lank Treating Provider/Extender: Skipper Cliche in Treatment: 6 Information Obtained from: Patient Chief Complaint Left LE Ulcer Electronic Signature(s) Signed: 02/05/2022 3:19:03 PM By: Worthy Keeler PA-C Entered By: Worthy Keeler on 02/05/2022 15:19:03 Martha [name] ([id number]) -------------------------------------------------------------------------------- HPI Details Patient Name: Martha [name] Date of Service: 02/05/2022 3:00 PM Medical Record Number: [id number] Patient Account Number: [id number] Date of Birth/Sex: 09/20/1955 (66 y.o. F) Treating RN: Cornell Barman Primary Care Provider: Denton Lank Other Clinician: Massie Kluver Referring Provider: Denton Lank Treating Provider/Extender: Skipper Cliche in Treatment: 6 History of Present Illness HPI Description: 12/25/2021 upon evaluation today patient appears to be doing okay in regard to her wound on the lateral aspect of her left leg. This is something that she tells me is somewhat recurrent. She does have a history of lower extremity edema. With that being said she tells me a lot of these areas she just takes care of overall and has done well last time she was [name] here in the office was actually in 2015. She is [name] with her daughter today and with the help of an interpreter we were able to evaluate her effectively today. The patient does tell me that her most recent hemoglobin A1c was 8.0 this was last week however the provider that she sees is not in our system. She also tells me that she has been using  Neosporin to dress the wound with an Dial soap to clean although occasionally if it itches she will use alcohol. With that being said I do not see any evidence of infection at this time which is great news she was placed on Bactrim DS and subsequently had what sounds to be a mild anaphylactic reaction which is not good we have added that to her list of allergies here in the clinic. Otherwise I do believe she is going to be able to effectively get this healed but she is having some issues here with an upcoming surgery for her right knee to replace that knee and then subsequently she will be doing the left knee as well following. The right knee surgery is scheduled for June 1 3 peripherally honest I am not sure we will have this healed by then I am not sure the surgeon is and want to proceed as long she has an open wound although that will be left up to Dr. Rudene Christians who is her surgeon. The patient does have a history of hypertension, diabetes mellitus type 2, chronic venous insufficiency, and bilateral knee osteoarthritis requiring arthroplasty. 01-08-2022 upon evaluation today patient appears to be doing well with regard to her wound. In fact she is showing signs of improvement which is great news this is measuring smaller. She has talked to her surgeon and they still plan to do her right knee replacement this is again opposite side of her wound and there is no signs of wound infection so I think she is probably okay in that regard. Fortunately there does not appear to be any evidence of active infection locally or systemically at this time which is great news. 01-15-2022 upon evaluation today patient appears to be doing well with regard to her wound. She has been tolerating the dressing changes  without complication. Fortunately there does not appear to be any signs of active infection locally or systemically which is great news. 01-22-2022 upon evaluation today patient appears to be doing well with regard to  her wounds. In fact the wound is significantly smaller which is great news and looks to be doing quite well. However her wrap was apparently a little bit tight last week she did not call and let us know she was not aware of that that was something that she should have done. I did advise her in the future if she has any issues with the wrap to please let me know so that we can correct that. Obviously I do not want her to have issues like she did and be very uncomfortable during the interim between the sides which it can also cause problems obviously. 6/13; small wound on the left lateral lower leg. She has been using silver collagen. Slight improvement in dimensions today 02-05-2022 upon evaluation today patient's wound actually showing signs of good improvement. Fortunately there does not appear to be any evidence of active infection locally or systemically at this time which is great news. No fevers, chills, nausea, vomiting, or diarrhea. Electronic Signature(s) Signed: 02/05/2022 3:47:18 PM By: Worthy Keeler PA-C Entered By: Worthy Keeler on 02/05/2022 15:47:18 Martha [name] ([id number]) -------------------------------------------------------------------------------- Physical Exam Details Patient Name: Martha [name] Date of Service: 02/05/2022 3:00 PM Medical Record Number: [id number] Patient Account Number: [id number] Date of Birth/Sex: 1956-07-20 (66 y.o. F) Treating RN: Cornell Barman Primary Care Provider: Denton Lank Other Clinician: Massie Kluver Referring Provider: Denton Lank Treating Provider/Extender: Jeri Cos Weeks in Treatment: 6 Constitutional Obese and well-hydrated in no acute distress. Respiratory normal breathing without difficulty. Psychiatric this patient is able to make decisions and demonstrates good insight into disease process. Alert and Oriented x 3. pleasant and cooperative. Notes Upon inspection patient's wound bed showed evidence of good  granulation and epithelization at this point. Fortunately I do not see any evidence of infection or just a very small opening remaining at this time and overall I think that she is very close to complete resolution. Overall she may do better with using Tubigrip at this point being able to change this more frequently so that it does not continue to dry out based on what I am seeing at this point. Electronic Signature(s) Signed: 02/05/2022 3:47:31 PM By: Worthy Keeler PA-C Entered By: Worthy Keeler on 02/05/2022 15:47:31 Martha [name] ([id number]) -------------------------------------------------------------------------------- Physician Orders Details Patient Name: Martha [name] Date of Service: 02/05/2022 3:00 PM Medical Record Number: [id number] Patient Account Number: [id number] Date of Birth/Sex: [birth year] (66 y.o. F) Treating RN: Levora Dredge Primary Care Provider: Denton Lank Other Clinician: Massie Kluver Referring Provider: Denton Lank Treating Provider/Extender: Skipper Cliche in Treatment: 6 Verbal / Phone Orders: No Diagnosis Coding ICD-10 Coding Code Description E11.622 Type 2 diabetes mellitus with other skin ulcer L97.822 Non-pressure chronic ulcer of other part of left lower leg with fat layer exposed I87.312 Chronic venous hypertension (idiopathic) with ulcer of left lower extremity I10 Essential (primary) hypertension Follow-up Appointments o Return Appointment in 1 week. o Nurse Visit as needed Bathing/ Shower/ Hygiene o May shower with wound dressing protected with water repellent cover or cast protector. o No tub bath. Edema Control - Lymphedema / Segmental Compressive Device / Other o Elevate, Exercise Daily and Avoid Standing for Long Periods of Time. o Elevate leg(s) parallel to the floor when sitting. o DO YOUR BEST to sleep in the bed  at night. DO NOT sleep in your recliner. Long hours of sitting in a recliner  leads to swelling of the legs and/or potential wounds on your backside. Wound Treatment Wound #4 - Lower Leg Wound Laterality: Left, Lateral Cleanser: Normal Saline 3 x Per Week/30 Days Discharge Instructions: Wash your hands with soap and water. Remove old dressing, discard into plastic bag and place into trash. Cleanse the wound with Normal Saline prior to applying a clean dressing using gauze sponges, not tissues or cotton balls. Do not scrub or use excessive force. Pat dry using gauze sponges, not tissue or cotton balls. Cleanser: Soap and Water 3 x Per Week/30 Days Discharge Instructions: Gently cleanse wound with antibacterial soap, rinse and pat dry prior to dressing wounds Primary Dressing: Xeroform 5x9-HBD (in/in) 3 x Per Week/30 Days Discharge Instructions: Apply Xeroform 5x9-HBD (in/in) as directed Secondary Dressing: Coverlet Latex-Free Fabric Adhesive Dressings 3 x Per Week/30 Days Discharge Instructions: 1.5 x 2 Secured With: Tubigrip Size C, 2.75x10 (in/yd) 3 x Per Week/30 Days Discharge Instructions: Apply 3 Tubigrip C 3-finger-widths below knee to base of toes to secure dressing and/or for swelling. Electronic Signature(s) Signed: 02/05/2022 4:38:54 PM By: Levora Dredge Signed: 02/06/2022 8:55:04 AM By: Worthy Keeler PA-C Entered By: Levora Dredge on 02/05/2022 16:20:59 Martha [name] ([id number]) -------------------------------------------------------------------------------- Problem List Details Patient Name: Martha [name] Date of Service: 02/05/2022 3:00 PM Medical Record Number: [id number] Patient Account Number: [id number] Date of Birth/Sex: 1956/01/06 (66 y.o. F) Treating RN: Cornell Barman Primary Care Provider: Denton Lank Other Clinician: Massie Kluver Referring Provider: Denton Lank Treating Provider/Extender: Jeri Cos Weeks in Treatment: 6 Active Problems ICD-10 Encounter Code Description Active Date MDM Diagnosis E11.622 Type 2  diabetes mellitus with other skin ulcer 12/25/2021 No Yes L97.822 Non-pressure chronic ulcer of other part of left lower leg with fat layer 12/25/2021 No Yes exposed I87.312 Chronic venous hypertension (idiopathic) with ulcer of left lower 12/25/2021 No Yes extremity I10 Essential (primary) hypertension 12/25/2021 No Yes Inactive Problems Resolved Problems Electronic Signature(s) Signed: 02/05/2022 3:18:58 PM By: Worthy Keeler PA-C Entered By: Worthy Keeler on 02/05/2022 15:18:58 Martha [name] ([id number]) -------------------------------------------------------------------------------- Progress Note Details Patient Name: Martha [name] Date of Service: 02/05/2022 3:00 PM Medical Record Number: [id number] Patient Account Number: [id number] Date of Birth/Sex: 12/10/1955 (66 y.o. F) Treating RN: Cornell Barman Primary Care Provider: Denton Lank Other Clinician: Massie Kluver Referring Provider: Denton Lank Treating Provider/Extender: Skipper Cliche in Treatment: 6 Subjective Chief Complaint Information obtained from Patient Left LE Ulcer History of Present Illness (HPI) 12/25/2021 upon evaluation today patient appears to be doing okay in regard to her wound on the lateral aspect of her left leg. This is something that she tells me is somewhat recurrent. She does have a history of lower extremity edema. With that being said she tells me a lot of these areas she just takes care of overall and has done well last time she was [name] here in the office was actually in 2015. She is [name] with her daughter today and with the help of an interpreter we were able to evaluate her effectively today. The patient does tell me that her most recent hemoglobin A1c was 8.0 this was last week however the provider that she sees is not in our system. She also tells me that she has been using Neosporin to dress the wound with an Dial soap to clean although occasionally if it itches she will use  alcohol. With that being said I do not see any  evidence of infection at this time which is great news she was placed on Bactrim DS and subsequently had what sounds to be a mild anaphylactic reaction which is not good we have added that to her list of allergies here in the clinic. Otherwise I do believe she is going to be able to effectively get this healed but she is having some issues here with an upcoming surgery for her right knee to replace that knee and then subsequently she will be doing the left knee as well following. The right knee surgery is scheduled for June 1 3 peripherally honest I am not sure we will have this healed by then I am not sure the surgeon is and want to proceed as long she has an open wound although that will be left up to Dr. Rudene Christians who is her surgeon. The patient does have a history of hypertension, diabetes mellitus type 2, chronic venous insufficiency, and bilateral knee osteoarthritis requiring arthroplasty. 01-08-2022 upon evaluation today patient appears to be doing well with regard to her wound. In fact she is showing signs of improvement which is great news this is measuring smaller. She has talked to her surgeon and they still plan to do her right knee replacement this is again opposite side of her wound and there is no signs of wound infection so I think she is probably okay in that regard. Fortunately there does not appear to be any evidence of active infection locally or systemically at this time which is great news. 01-15-2022 upon evaluation today patient appears to be doing well with regard to her wound. She has been tolerating the dressing changes without complication. Fortunately there does not appear to be any signs of active infection locally or systemically which is great news. 01-22-2022 upon evaluation today patient appears to be doing well with regard to her wounds. In fact the wound is significantly smaller which is great news and looks to be doing quite  well. However her wrap was apparently a little bit tight last week she did not call and let us know she was not aware of that that was something that she should have done. I did advise her in the future if she has any issues with the wrap to please let me know so that we can correct that. Obviously I do not want her to have issues like she did and be very uncomfortable during the interim between the sides which it can also cause problems obviously. 6/13; small wound on the left lateral lower leg. She has been using silver collagen. Slight improvement in dimensions today 02-05-2022 upon evaluation today patient's wound actually showing signs of good improvement. Fortunately there does not appear to be any evidence of active infection locally or systemically at this time which is great news. No fevers, chills, nausea, vomiting, or diarrhea. Objective Constitutional Obese and well-hydrated in no acute distress. Vitals Time Taken: 3:08 PM, Height: 64 in, Weight: 236 lbs, BMI: 40.5, Temperature: 97.8 F, Pulse: 114 bpm, Respiratory Rate: 18 breaths/min, Blood Pressure: 132/97 mmHg. Respiratory normal breathing without difficulty. Psychiatric this patient is able to make decisions and demonstrates good insight into disease process. Alert and Oriented x 3. pleasant and cooperative. MAYELI, BORNHORST ([id number]) General Notes: Upon inspection patient's wound bed showed evidence of good granulation and epithelization at this point. Fortunately I do not see any evidence of infection or just a very small opening remaining at this time and overall I think that she is very close to  complete resolution. Overall she may do better with using Tubigrip at this point being able to change this more frequently so that it does not continue to dry out based on what I am seeing at this point. Integumentary (Hair, Skin) Wound #4 status is Open. Original cause of wound was Gradually Appeared. The date acquired  was: 08/19/2021. The wound has been in treatment 6 weeks. The wound is located on the Left,Lateral Lower Leg. The wound measures 0.1cm length x 0.1cm width x 0.1cm depth; 0.008cm^2 area and 0.001cm^3 volume. There is Fat Layer (Subcutaneous Tissue) exposed. There is no tunneling or undermining noted. There is a medium amount of serosanguineous drainage noted. There is large (67-100%) red, pink granulation within the wound bed. There is no necrotic tissue within the wound bed. Assessment Active Problems ICD-10 Type 2 diabetes mellitus with other skin ulcer Non-pressure chronic ulcer of other part of left lower leg with fat layer exposed Chronic venous hypertension (idiopathic) with ulcer of left lower extremity Essential (primary) hypertension Plan Follow-up Appointments: Return Appointment in 1 week. Nurse Visit as needed Bathing/ Shower/ Hygiene: May shower with wound dressing protected with water repellent cover or cast protector. No tub bath. Edema Control - Lymphedema / Segmental Compressive Device / Other: Elevate, Exercise Daily and Avoid Standing for Long Periods of Time. Elevate leg(s) parallel to the floor when sitting. DO YOUR BEST to sleep in the bed at night. DO NOT sleep in your recliner. Long hours of sitting in a recliner leads to swelling of the legs and/or potential wounds on your backside. WOUND #4: - Lower Leg Wound Laterality: Left, Lateral Cleanser: Normal Saline 1 x Per Week/30 Days Discharge Instructions: Wash your hands with soap and water. Remove old dressing, discard into plastic bag and place into trash. Cleanse the wound with Normal Saline prior to applying a clean dressing using gauze sponges, not tissues or cotton balls. Do not scrub or use excessive force. Pat dry using gauze sponges, not tissue or cotton balls. Cleanser: Soap and Water 1 x Per Week/30 Days Discharge Instructions: Gently cleanse wound with antibacterial soap, rinse and pat dry prior to  dressing wounds Primary Dressing: Xeroform 5x9-HBD (in/in) 1 x Per Week/30 Days Discharge Instructions: Apply Xeroform 5x9-HBD (in/in) as directed Secondary Dressing: ABD Pad 5x9 (in/in) 1 x Per Week/30 Days Discharge Instructions: Cover with ABD pad Secondary Dressing: Conforming Guaze Roll-Medium 1 x Per Week/30 Days Discharge Instructions: Apply Conforming Stretch Guaze Bandage as directed Secured With: Tubigrip Size C, 2.75x10 (in/yd) 1 x Per Week/30 Days Discharge Instructions: Apply 3 Tubigrip C 3-finger-widths below knee to base of toes to secure dressing and/or for swelling. 1. I would recommend currently that we go ahead and continue with changing the dressings I Georgina Peer have her do this actually on a schedule however 3 times a week using Xeroform and just a Band-Aid to cover. 2. Also can recommend that we use Tubigrip size C which I think will be sufficient to help with edema control based on what I am seeing as well. 3. I also believe she is okay to take a shower at this point she is able to use Dial antibacterial soap to wash the wound area. We will see patient back for reevaluation in 1 week here in the clinic. If anything worsens or changes patient will contact our office for additional recommendations. Electronic Signature(s) Signed: 02/05/2022 3:48:14 PM By: Worthy Keeler PA-C Entered By: Worthy Keeler on 02/05/2022 15:48:13 Martha [name] ([id number]) Kimberly, Denton Brick ([id number]) --------------------------------------------------------------------------------  SuperBill Details Patient Name: GWENNA, FUSTON Date of Service: 02/05/2022 Medical Record Number: [id number] Patient Account Number: [id number] Date of Birth/Sex: [birth year] (66 y.o. F) Treating RN: Cornell Barman Primary Care Provider: Denton Lank Other Clinician: Massie Kluver Referring Provider: Denton Lank Treating Provider/Extender: Jeri Cos Weeks in Treatment: 6 Diagnosis  Coding ICD-10 Codes Code Description [phone] Type 2 diabetes mellitus with other skin ulcer L97.822 Non-pressure chronic ulcer of other part of left lower leg with fat layer exposed I87.312 Chronic venous hypertension (idiopathic) with ulcer of left lower extremity I10 Essential (primary) hypertension Facility Procedures CPT4 Code: [id number] Description: 99213 - WOUND CARE VISIT-LEV 3 EST PT Modifier: Quantity: 1 Physician Procedures CPT4 Code: [id number] Description: 99213 - WC PHYS LEVEL 3 - EST PT Modifier: Quantity: 1 CPT4 Code: Description: ICD-10 Diagnosis Description E11.622 Type 2 diabetes mellitus with other skin ulcer L97.822 Non-pressure chronic ulcer of other part of left lower leg with fat lay I87.312 Chronic venous hypertension (idiopathic) with ulcer of left lower  extre I10 Essential (primary) hypertension Modifier: er exposed mity Quantity: Electronic Signature(s) Signed: 02/05/2022 4:22:52 PM By: Levora Dredge Signed: 02/06/2022 8:55:04 AM By: Worthy Keeler PA-C Previous Signature: 02/05/2022 3:48:24 PM Version By: Worthy Keeler PA-C Entered By: Levora Dredge on 02/05/2022 16:22:52

## 2022-02-12 ENCOUNTER — Encounter: Payer: Medicare HMO | Admitting: Physician Assistant

## 2022-02-12 DIAGNOSIS — E11622 Type 2 diabetes mellitus with other skin ulcer: Secondary | ICD-10-CM | POA: Diagnosis not present

## 2022-02-12 NOTE — Progress Notes (Addendum)
Martha, [name] ([id number]) Visit Report for 02/12/2022 Chief Complaint Document Details Patient Name: Martha [name], Martha [name] Date of Service: 02/12/2022 3:00 PM Medical Record Number: [id number] Patient Account Number: [id number] Date of Birth/Sex: 09/12/1955 (66 y.o. F) Treating RN: Cornell Barman Primary Care Provider: Denton Lank Other Clinician: Massie Kluver Referring Provider: Denton Lank Treating Provider/Extender: Skipper Cliche in Treatment: 7 Information Obtained from: Patient Chief Complaint Left LE Ulcer Electronic Signature(s) Signed: 02/12/2022 2:43:30 PM By: Worthy Keeler PA-C Entered By: Worthy Keeler on 02/12/2022 14:43:30 Martha [name] ([id number]) -------------------------------------------------------------------------------- HPI Details Patient Name: Martha [name] Date of Service: 02/12/2022 3:00 PM Medical Record Number: [id number] Patient Account Number: [id number] Date of Birth/Sex: 1956/03/22 (66 y.o. F) Treating RN: Cornell Barman Primary Care Provider: Denton Lank Other Clinician: Massie Kluver Referring Provider: Denton Lank Treating Provider/Extender: Skipper Cliche in Treatment: 7 History of Present Illness HPI Description: 12/25/2021 upon evaluation today patient appears to be doing okay in regard to her wound on the lateral aspect of her left leg. This is something that she tells me is somewhat recurrent. She does have a history of lower extremity edema. With that being said she tells me a lot of these areas she just takes care of overall and has done well last time she was [name] here in the office was actually in 2015. She is [name] with her daughter today and with the help of an interpreter we were able to evaluate her effectively today. The patient does tell me that her most recent hemoglobin A1c was 8.0 this was last week however the provider that she sees is not in our system. She also tells me that she has been using  Neosporin to dress the wound with an Dial soap to clean although occasionally if it itches she will use alcohol. With that being said I do not see any evidence of infection at this time which is great news she was placed on Bactrim DS and subsequently had what sounds to be a mild anaphylactic reaction which is not good we have added that to her list of allergies here in the clinic. Otherwise I do believe she is going to be able to effectively get this healed but she is having some issues here with an upcoming surgery for her right knee to replace that knee and then subsequently she will be doing the left knee as well following. The right knee surgery is scheduled for June 1 3 peripherally honest I am not sure we will have this healed by then I am not sure the surgeon is and want to proceed as long she has an open wound although that will be left up to Dr. Rudene Christians who is her surgeon. The patient does have a history of hypertension, diabetes mellitus type 2, chronic venous insufficiency, and bilateral knee osteoarthritis requiring arthroplasty. 01-08-2022 upon evaluation today patient appears to be doing well with regard to her wound. In fact she is showing signs of improvement which is great news this is measuring smaller. She has talked to her surgeon and they still plan to do her right knee replacement this is again opposite side of her wound and there is no signs of wound infection so I think she is probably okay in that regard. Fortunately there does not appear to be any evidence of active infection locally or systemically at this time which is great news. 01-15-2022 upon evaluation today patient appears to be doing well with regard to her wound. She has been tolerating the dressing changes  without complication. Fortunately there does not appear to be any signs of active infection locally or systemically which is great news. 01-22-2022 upon evaluation today patient appears to be doing well with regard to  her wounds. In fact the wound is significantly smaller which is great news and looks to be doing quite well. However her wrap was apparently a little bit tight last week she did not call and let us know she was not aware of that that was something that she should have done. I did advise her in the future if she has any issues with the wrap to please let me know so that we can correct that. Obviously I do not want her to have issues like she did and be very uncomfortable during the interim between the sides which it can also cause problems obviously. 6/13; small wound on the left lateral lower leg. She has been using silver collagen. Slight improvement in dimensions today 02-05-2022 upon evaluation today patient's wound actually showing signs of good improvement. Fortunately there does not appear to be any evidence of active infection locally or systemically at this time which is great news. No fevers, chills, nausea, vomiting, or diarrhea. 02-12-2022 upon evaluation today patient appears to be doing very well in regard to her wound in fact I feel like everything is showing signs of being completely healed which is great news. Fortunately I do not see any evidence of active infection locally or systemically which is great news. Electronic Signature(s) Signed: 02/13/2022 9:25:53 AM By: Worthy Keeler PA-C Entered By: Worthy Keeler on 02/13/2022 09:25:52 Martha [name] ([id number]) -------------------------------------------------------------------------------- Physical Exam Details Patient Name: Martha [name] Date of Service: 02/12/2022 3:00 PM Medical Record Number: [id number] Patient Account Number: [id number] Date of Birth/Sex: 20-Oct-1955 (66 y.o. F) Treating RN: Cornell Barman Primary Care Provider: Denton Lank Other Clinician: Massie Kluver Referring Provider: Denton Lank Treating Provider/Extender: Jeri Cos Weeks in Treatment: 7 Constitutional Obese and well-hydrated  in no acute distress. Respiratory normal breathing without difficulty. Psychiatric this patient is able to make decisions and demonstrates good insight into disease process. Alert and Oriented x 3. pleasant and cooperative. Notes Upon inspection patient's wound bed showed signs again of complete epithelization and overall I am extremely happy. She I do believe is ready for discharge and overall I think that we are in a good place here. Electronic Signature(s) Signed: 02/13/2022 9:26:11 AM By: Worthy Keeler PA-C Entered By: Worthy Keeler on 02/13/2022 09:26:11 Martha [name] ([id number]) -------------------------------------------------------------------------------- Physician Orders Details Patient Name: Martha [name] Date of Service: 02/12/2022 3:00 PM Medical Record Number: [id number] Patient Account Number: [id number] Date of Birth/Sex: 1956-03-08 (66 y.o. F) Treating RN: Cornell Barman Primary Care Provider: Denton Lank Other Clinician: Massie Kluver Referring Provider: Denton Lank Treating Provider/Extender: Skipper Cliche in Treatment: 7 Verbal / Phone Orders: No Diagnosis Coding ICD-10 Coding Code Description E11.622 Type 2 diabetes mellitus with other skin ulcer L97.822 Non-pressure chronic ulcer of other part of left lower leg with fat layer exposed I87.312 Chronic venous hypertension (idiopathic) with ulcer of left lower extremity I10 Essential (primary) hypertension Discharge From [facility] o Discharge from [facility] - Wound healed. Please call wound center if any issues arise with the wound o Wear compression garments daily. Put garments on first thing when you wake up and remove them before bed. - wear Tubi grip size c daily Electronic Signature(s) Signed: 02/12/2022 4:19:57 PM By: Massie Kluver Signed: 02/15/2022 4:59:17 PM By: Worthy Keeler  PA-C Entered By: Massie Kluver on 02/12/2022  15:40:21 Martha [name] ([id number]) -------------------------------------------------------------------------------- Problem List Details Patient Name: Martha [name] Date of Service: 02/12/2022 3:00 PM Medical Record Number: [id number] Patient Account Number: [id number] Date of Birth/Sex: February 27, 1956 (66 y.o. F) Treating RN: Cornell Barman Primary Care Provider: Denton Lank Other Clinician: Massie Kluver Referring Provider: Denton Lank Treating Provider/Extender: Jeri Cos Weeks in Treatment: 7 Active Problems ICD-10 Encounter Code Description Active Date MDM Diagnosis E11.622 Type 2 diabetes mellitus with other skin ulcer 12/25/2021 No Yes L97.822 Non-pressure chronic ulcer of other part of left lower leg with fat layer 12/25/2021 No Yes exposed I87.312 Chronic venous hypertension (idiopathic) with ulcer of left lower 12/25/2021 No Yes extremity I10 Essential (primary) hypertension 12/25/2021 No Yes Inactive Problems Resolved Problems Electronic Signature(s) Signed: 02/12/2022 2:43:27 PM By: Worthy Keeler PA-C Entered By: Worthy Keeler on 02/12/2022 14:43:27 Martha [name] ([id number]) -------------------------------------------------------------------------------- Progress Note Details Patient Name: Martha [name] Date of Service: 02/12/2022 3:00 PM Medical Record Number: [id number] Patient Account Number: [id number] Date of Birth/Sex: 07-01-1956 (66 y.o. F) Treating RN: Cornell Barman Primary Care Provider: Denton Lank Other Clinician: Massie Kluver Referring Provider: Denton Lank Treating Provider/Extender: Skipper Cliche in Treatment: 7 Subjective Chief Complaint Information obtained from Patient Left LE Ulcer History of Present Illness (HPI) 12/25/2021 upon evaluation today patient appears to be doing okay in regard to her wound on the lateral aspect of her left leg. This is something that she tells me is somewhat recurrent. She does  have a history of lower extremity edema. With that being said she tells me a lot of these areas she just takes care of overall and has done well last time she was [name] here in the office was actually in 2015. She is [name] with her daughter today and with the help of an interpreter we were able to evaluate her effectively today. The patient does tell me that her most recent hemoglobin A1c was 8.0 this was last week however the provider that she sees is not in our system. She also tells me that she has been using Neosporin to dress the wound with an Dial soap to clean although occasionally if it itches she will use alcohol. With that being said I do not see any evidence of infection at this time which is great news she was placed on Bactrim DS and subsequently had what sounds to be a mild anaphylactic reaction which is not good we have added that to her list of allergies here in the clinic. Otherwise I do believe she is going to be able to effectively get this healed but she is having some issues here with an upcoming surgery for her right knee to replace that knee and then subsequently she will be doing the left knee as well following. The right knee surgery is scheduled for June 1 3 peripherally honest I am not sure we will have this healed by then I am not sure the surgeon is and want to proceed as long she has an open wound although that will be left up to Dr. Rudene Christians who is her surgeon. The patient does have a history of hypertension, diabetes mellitus type 2, chronic venous insufficiency, and bilateral knee osteoarthritis requiring arthroplasty. 01-08-2022 upon evaluation today patient appears to be doing well with regard to her wound. In fact she is showing signs of improvement which is great news this is measuring smaller. She has talked to her surgeon and they still plan to do her right knee  replacement this is again opposite side of her wound and there is no signs of wound infection so I think she  is probably okay in that regard. Fortunately there does not appear to be any evidence of active infection locally or systemically at this time which is great news. 01-15-2022 upon evaluation today patient appears to be doing well with regard to her wound. She has been tolerating the dressing changes without complication. Fortunately there does not appear to be any signs of active infection locally or systemically which is great news. 01-22-2022 upon evaluation today patient appears to be doing well with regard to her wounds. In fact the wound is significantly smaller which is great news and looks to be doing quite well. However her wrap was apparently a little bit tight last week she did not call and let us know she was not aware of that that was something that she should have done. I did advise her in the future if she has any issues with the wrap to please let me know so that we can correct that. Obviously I do not want her to have issues like she did and be very uncomfortable during the interim between the sides which it can also cause problems obviously. 6/13; small wound on the left lateral lower leg. She has been using silver collagen. Slight improvement in dimensions today 02-05-2022 upon evaluation today patient's wound actually showing signs of good improvement. Fortunately there does not appear to be any evidence of active infection locally or systemically at this time which is great news. No fevers, chills, nausea, vomiting, or diarrhea. 02-12-2022 upon evaluation today patient appears to be doing very well in regard to her wound in fact I feel like everything is showing signs of being completely healed which is great news. Fortunately I do not see any evidence of active infection locally or systemically which is great news. Objective Constitutional Obese and well-hydrated in no acute distress. Vitals Time Taken: 3:08 PM, Height: 64 in, Weight: 236 lbs, BMI: 40.5, Temperature: 98.2 F, Pulse:  92 bpm, Respiratory Rate: 18 breaths/min, Blood Pressure: 142/86 mmHg. Respiratory normal breathing without difficulty. EULONDA, ANDALON ([id number]) Psychiatric this patient is able to make decisions and demonstrates good insight into disease process. Alert and Oriented x 3. pleasant and cooperative. General Notes: Upon inspection patient's wound bed showed signs again of complete epithelization and overall I am extremely happy. She I do believe is ready for discharge and overall I think that we are in a good place here. Integumentary (Hair, Skin) Wound #4 status is Healed - Epithelialized. Original cause of wound was Gradually Appeared. The date acquired was: 08/19/2021. The wound has been in treatment 7 weeks. The wound is located on the Left,Lateral Lower Leg. The wound measures 0cm length x 0cm width x 0cm depth; 0cm^2 area and 0cm^3 volume. There is Fat Layer (Subcutaneous Tissue) exposed. There is a medium amount of serosanguineous drainage noted. There is large (67-100%) granulation within the wound bed. There is no necrotic tissue within the wound bed. Assessment Active Problems ICD-10 Type 2 diabetes mellitus with other skin ulcer Non-pressure chronic ulcer of other part of left lower leg with fat layer exposed Chronic venous hypertension (idiopathic) with ulcer of left lower extremity Essential (primary) hypertension Plan Discharge From [facility]: Discharge from [facility] - Wound healed. Please call wound center if any issues arise with the wound Wear compression garments daily. Put garments on first thing when you wake up and  remove them before bed. - wear Tubi grip size c daily 1. I am good recommend that we discontinue wound care services as the patient appears to be completely healed and she is in agreement with that plan. 2. I am also can recommend that we have the patient continue to monitor for any signs of worsening or infection if  anything changes she should contact the office and let me know otherwise I am hopeful she will not need to come back to be [name]. Follow-up as needed Electronic Signature(s) Signed: 02/13/2022 9:26:35 AM By: Worthy Keeler PA-C Entered By: Worthy Keeler on 02/13/2022 09:26:35 Martha [name] ([id number]) -------------------------------------------------------------------------------- SuperBill Details Patient Name: Martha [name] Date of Service: 02/12/2022 Medical Record Number: [id number] Patient Account Number: [id number] Date of Birth/Sex: [birth year] (66 y.o. F) Treating RN: Cornell Barman Primary Care Provider: Denton Lank Other Clinician: Massie Kluver Referring Provider: Denton Lank Treating Provider/Extender: Jeri Cos Weeks in Treatment: 7 Diagnosis Coding ICD-10 Codes Code Description E11.622 Type 2 diabetes mellitus with other skin ulcer L97.822 Non-pressure chronic ulcer of other part of left lower leg with fat layer exposed I87.312 Chronic venous hypertension (idiopathic) with ulcer of left lower extremity I10 Essential (primary) hypertension Facility Procedures CPT4 Code: 49753005 Description: [phone] - WOUND CARE VISIT-LEV 2 EST PT Modifier: Quantity: 1 Physician Procedures CPT4 Code: [id number] Description: 01410 - WC PHYS LEVEL 2 - EST PT Modifier: Quantity: 1 CPT4 Code: Description: ICD-10 Diagnosis Description E11.622 Type 2 diabetes mellitus with other skin ulcer L97.822 Non-pressure chronic ulcer of other part of left lower leg with fat lay I87.312 Chronic venous hypertension (idiopathic) with ulcer of left lower  extre I10 Essential (primary) hypertension Modifier: er exposed mity Quantity: Electronic Signature(s) Signed: 02/13/2022 9:26:49 AM By: Worthy Keeler PA-C Entered By: Worthy Keeler on 02/13/2022 09:26:48

## 2022-02-12 NOTE — Progress Notes (Addendum)
Martha, [name] ([id number]) Visit Report for 02/12/2022 Arrival Information Details Patient Name: Martha [name], Martha [name] Date of Service: 02/12/2022 3:00 PM Medical Record Number: [id number] Patient Account Number: [id number] Date of Birth/Sex: Mar 06, 1956 (66 y.o. F) Treating RN: Cornell Barman Primary Care Phuong Hillary: Denton Lank Other Clinician: Massie Kluver Referring Ellagrace Yoshida: Denton Lank Treating Mazen Marcin/Extender: Skipper Cliche in Treatment: 7 Visit Information History Since Last Visit All ordered tests and consults were completed: No Patient Arrived: Wheel Chair Added or deleted any medications: No Arrival Time: 15:07 Any new allergies or adverse reactions: No Transfer Assistance: None Had a fall or experienced change in No activities of daily living that may affect risk of falls: Hospitalized since last visit: No Pain Present Now: No Electronic Signature(s) Signed: 02/12/2022 4:19:57 PM By: Massie Kluver Entered By: Massie Kluver on 02/12/2022 15:08:51 Martha [name] ([id number]) -------------------------------------------------------------------------------- Clinic Level of Care Assessment Details Patient Name: Martha [name] Date of Service: 02/12/2022 3:00 PM Medical Record Number: [id number] Patient Account Number: [id number] Date of Birth/Sex: 1956/07/28 (66 y.o. F) Treating RN: Cornell Barman Primary Care Aprille Sawhney: Denton Lank Other Clinician: Massie Kluver Referring Deema Juncaj: Denton Lank Treating Denisha Hoel/Extender: Skipper Cliche in Treatment: 7 Clinic Level of Care Assessment Items TOOL 4 Quantity Score '[]'$  - Use when only an EandM is performed on FOLLOW-UP visit 0 ASSESSMENTS - Nursing Assessment / Reassessment X - Reassessment of Co-morbidities (includes updates in patient status) 1 10 X- 1 5 Reassessment of Adherence to Treatment Plan ASSESSMENTS - Wound and Skin Assessment / Reassessment X - Simple Wound Assessment /  Reassessment - one wound 1 5 '[]'$  - 0 Complex Wound Assessment / Reassessment - multiple wounds '[]'$  - 0 Dermatologic / Skin Assessment (not related to wound area) ASSESSMENTS - Focused Assessment '[]'$  - Circumferential Edema Measurements - multi extremities 0 '[]'$  - 0 Nutritional Assessment / Counseling / Intervention '[]'$  - 0 Lower Extremity Assessment (monofilament, tuning fork, pulses) '[]'$  - 0 Peripheral Arterial Disease Assessment (using hand held doppler) ASSESSMENTS - Ostomy and/or Continence Assessment and Care '[]'$  - Incontinence Assessment and Management 0 '[]'$  - 0 Ostomy Care Assessment and Management (repouching, etc.) PROCESS - Coordination of Care X - Simple Patient / Family Education for ongoing care 1 15 '[]'$  - 0 Complex (extensive) Patient / Family Education for ongoing care '[]'$  - 0 Staff obtains Programmer, systems, Records, Test Results / Process Orders '[]'$  - 0 Staff telephones HHA, Nursing Homes / Clarify orders / etc '[]'$  - 0 Routine Transfer to another Facility (non-emergent condition) '[]'$  - 0 Routine Hospital Admission (non-emergent condition) '[]'$  - 0 New Admissions / Biomedical engineer / Ordering NPWT, Apligraf, etc. '[]'$  - 0 Emergency Hospital Admission (emergent condition) X- 1 10 Simple Discharge Coordination '[]'$  - 0 Complex (extensive) Discharge Coordination PROCESS - Special Needs '[]'$  - Pediatric / Minor Patient Management 0 '[]'$  - 0 Isolation Patient Management '[]'$  - 0 Hearing / Language / Visual special needs '[]'$  - 0 Assessment of Community assistance (transportation, D/C planning, etc.) '[]'$  - 0 Additional assistance / Altered mentation '[]'$  - 0 Support Surface(s) Assessment (bed, cushion, seat, etc.) INTERVENTIONS - Wound Cleansing / Measurement HENRIQUEZ-RIVERA, [place] ([id number]) X- 1 5 Simple Wound Cleansing - one wound '[]'$  - 0 Complex Wound Cleansing - multiple wounds X- 1 5 Wound Imaging (photographs - any number of wounds) '[]'$  - 0 Wound Tracing (instead of  photographs) '[]'$  - 0 Simple Wound Measurement - one wound '[]'$  - 0 Complex Wound Measurement - multiple wounds INTERVENTIONS - Wound Dressings '[]'$  - Small Wound Dressing one  or multiple wounds 0 '[]'$  - 0 Medium Wound Dressing one or multiple wounds '[]'$  - 0 Large Wound Dressing one or multiple wounds X- 1 5 Application of Medications - topical '[]'$  - 0 Application of Medications - injection INTERVENTIONS - Miscellaneous '[]'$  - External ear exam 0 '[]'$  - 0 Specimen Collection (cultures, biopsies, blood, body fluids, etc.) '[]'$  - 0 Specimen(s) / Culture(s) sent or taken to Lab for analysis '[]'$  - 0 Patient Transfer (multiple staff / Civil Service fast streamer / Similar devices) '[]'$  - 0 Simple Staple / Suture removal (25 or less) '[]'$  - 0 Complex Staple / Suture removal (26 or more) '[]'$  - 0 Hypo / Hyperglycemic Management (close monitor of Blood Glucose) '[]'$  - 0 Ankle / Brachial Index (ABI) - do not check if billed separately X- 1 5 Vital Signs Has the patient been [name] at the hospital within the last three years: Yes Total Score: 65 Level Of Care: New/Established - Level 2 Electronic Signature(s) Signed: 02/12/2022 4:19:57 PM By: Massie Kluver Entered By: Massie Kluver on 02/12/2022 15:41:31 Martha [name] ([id number]) -------------------------------------------------------------------------------- Encounter Discharge Information Details Patient Name: Martha [name] Date of Service: 02/12/2022 3:00 PM Medical Record Number: [id number] Patient Account Number: [id number] Date of Birth/Sex: [birth year] (66 y.o. F) Treating RN: Cornell Barman Primary Care Caris Cerveny: Denton Lank Other Clinician: Massie Kluver Referring Chalsea Darko: Denton Lank Treating Lynore Coscia/Extender: Skipper Cliche in Treatment: 7 Encounter Discharge Information Items Discharge Condition: Stable Ambulatory Status: Walker Discharge Destination: Home Transportation: Private Auto Accompanied By: daughter Schedule Follow-up  Appointment: Yes Clinical Summary of Care: Electronic Signature(s) Signed: 02/12/2022 4:19:57 PM By: Massie Kluver Entered By: Massie Kluver on 02/12/2022 15:53:49 Martha [name] ([id number]) -------------------------------------------------------------------------------- Lower Extremity Assessment Details Patient Name: Martha [name] Date of Service: 02/12/2022 3:00 PM Medical Record Number: [id number] Patient Account Number: [id number] Date of Birth/Sex: Dec 26, 1955 (66 y.o. F) Treating RN: Cornell Barman Primary Care Coriann Brouhard: Denton Lank Other Clinician: Massie Kluver Referring Zaidin Blyden: Denton Lank Treating Keondre Markson/Extender: Jeri Cos Weeks in Treatment: 7 Electronic Signature(s) Signed: 02/12/2022 4:19:57 PM By: Massie Kluver Signed: 02/13/2022 12:01:51 PM By: Gretta Cool, BSN, RN, CWS, Kim RN, BSN Entered By: Massie Kluver on 02/12/2022 15:38:26 Martha [name] ([id number]) -------------------------------------------------------------------------------- Multi Wound Chart Details Patient Name: Martha [name] Date of Service: 02/12/2022 3:00 PM Medical Record Number: [id number] Patient Account Number: [id number] Date of Birth/Sex: July 22, 1956 (66 y.o. F) Treating RN: Cornell Barman Primary Care Jaquawn Saffran: Denton Lank Other Clinician: Massie Kluver Referring Jannice Beitzel: Denton Lank Treating Sharalee Witman/Extender: Jeri Cos Weeks in Treatment: 7 Vital Signs Height(in): 76 Pulse(bpm): 59 Weight(lbs): 236 Blood Pressure(mmHg): 142/86 Body Mass Index(BMI): 40.5 Temperature(F): 98.2 Respiratory Rate(breaths/min): 18 Photos: [N/A:N/A] Wound Location: Left, Lateral Lower Leg N/A N/A Wounding Event: Gradually Appeared N/A N/A Primary Etiology: Venous Leg Ulcer N/A N/A Comorbid History: Hypertension, Type II Diabetes, N/A N/A Osteoarthritis, Neuropathy Date Acquired: 08/19/2021 N/A N/A Weeks of Treatment: 7 N/A N/A Wound Status: Healed - Epithelialized  N/A N/A Wound Recurrence: No N/A N/A Measurements L x W x D (cm) 0x0x0 N/A N/A Area (cm) : 0 N/A N/A Volume (cm) : 0 N/A N/A % Reduction in Area: 100.00% N/A N/A % Reduction in Volume: 100.00% N/A N/A Classification: Full Thickness Without Exposed N/A N/A Support Structures Exudate Amount: Medium N/A N/A Exudate Type: Serosanguineous N/A N/A Exudate Color: red, brown N/A N/A Granulation Amount: Large (67-100%) N/A N/A Necrotic Amount: None Present (0%) N/A N/A Exposed Structures: Fat Layer (Subcutaneous Tissue): N/A N/A Yes Epithelialization: Small (1-33%) N/A N/A Treatment Notes Electronic Signature(s) Signed: 02/12/2022 4:19:57 PM By:  Massie Kluver Entered By: Massie Kluver on 02/12/2022 15:38:39 Martha [name] ([id number]) -------------------------------------------------------------------------------- Multi-Disciplinary Care Plan Details Patient Name: SAE, HANDRICH Date of Service: 02/12/2022 3:00 PM Medical Record Number: [id number] Patient Account Number: [id number] Date of Birth/Sex: 09-Sep-1955 (66 y.o. F) Treating RN: Cornell Barman Primary Care Avelynn Sellin: Denton Lank Other Clinician: Massie Kluver Referring Tayo Maute: Denton Lank Treating Kinan Safley/Extender: Skipper Cliche in Treatment: 7 Active Inactive Electronic Signature(s) Signed: 03/01/2022 8:32:40 AM By: Gretta Cool, BSN, RN, CWS, Kim RN, BSN Previous Signature: 02/12/2022 4:19:57 PM Version By: Massie Kluver Previous Signature: 02/13/2022 12:01:51 PM Version By: Gretta Cool, BSN, RN, CWS, Kim RN, BSN Entered By: Gretta Cool, BSN, RN, CWS, Kim on 03/01/2022 08:32:39 Martha [name] ([id number]) -------------------------------------------------------------------------------- Pain Assessment Details Patient Name: Martha [name] Date of Service: 02/12/2022 3:00 PM Medical Record Number: [id number] Patient Account Number: [id number] Date of Birth/Sex: 09/26/1955 (66 y.o. F) Treating RN: Cornell Barman Primary Care Loukas Antonson: Denton Lank Other Clinician: Massie Kluver Referring Morris Markham: Denton Lank Treating Richmond Coldren/Extender: Jeri Cos Weeks in Treatment: 7 Active Problems Location of Pain Severity and Description of Pain Patient Has Paino No Site Locations Pain Management and Medication Current Pain Management: Electronic Signature(s) Signed: 02/12/2022 4:19:57 PM By: Massie Kluver Signed: 02/13/2022 12:01:51 PM By: Gretta Cool, BSN, RN, CWS, Kim RN, BSN Entered By: Massie Kluver on 02/12/2022 15:11:28 Martha [name] ([id number]) -------------------------------------------------------------------------------- Patient/Caregiver Education Details Patient Name: Martha [name] Date of Service: 02/12/2022 3:00 PM Medical Record Number: [id number] Patient Account Number: [id number] Date of Birth/Gender: [birth year] (66 y.o. F) Treating RN: Cornell Barman Primary Care Physician: Denton Lank Other Clinician: Massie Kluver Referring Physician: Denton Lank Treating Physician/Extender: Skipper Cliche in Treatment: 7 Education Assessment Education Provided To: Patient Education Topics Provided Wound/Skin Impairment: Handouts: Other: Wound has healed. Please call wound center if any issues arise with the wound Electronic Signature(s) Signed: 02/12/2022 4:19:57 PM By: Massie Kluver Entered By: Massie Kluver on 02/12/2022 15:42:30 Martha [name] ([id number]) -------------------------------------------------------------------------------- Wound Assessment Details Patient Name: Martha [name] Date of Service: 02/12/2022 3:00 PM Medical Record Number: [id number] Patient Account Number: [id number] Date of Birth/Sex: 30-Oct-1955 (66 y.o. F) Treating RN: Cornell Barman Primary Care Braxston Quinter: Denton Lank Other Clinician: Massie Kluver Referring Yusuke Beza: Denton Lank Treating Akaya Proffit/Extender: Jeri Cos Weeks in Treatment: 7 Wound Status Wound  Number: 4 Primary Etiology: Venous Leg Ulcer Wound Location: Left, Lateral Lower Leg Wound Status: Healed - Epithelialized Wounding Event: Gradually Appeared Comorbid Hypertension, Type II Diabetes, Osteoarthritis, History: Neuropathy Date Acquired: 08/19/2021 Weeks Of Treatment: 7 Clustered Wound: No Photos Wound Measurements Length: (cm) 0 Width: (cm) 0 Depth: (cm) 0 Area: (cm) Volume: (cm) % Reduction in Area: 100% % Reduction in Volume: 100% Epithelialization: Small (1-33%) 0 0 Wound Description Classification: Full Thickness Without Exposed Support Structu Exudate Amount: Medium Exudate Type: Serosanguineous Exudate Color: red, brown res Foul Odor After Cleansing: No Slough/Fibrino Yes Wound Bed Granulation Amount: Large (67-100%) Exposed Structure Necrotic Amount: None Present (0%) Fat Layer (Subcutaneous Tissue) Exposed: Yes Treatment Notes Wound #4 (Lower Leg) Wound Laterality: Left, Lateral Cleanser Peri-Wound Care Topical Primary Dressing Secondary Dressing Secured With Compression Wrap LYNNIAH, JANOSKI ([id number]) Compression Stockings Add-Ons Electronic Signature(s) Signed: 02/12/2022 4:19:57 PM By: Massie Kluver Signed: 02/13/2022 12:01:51 PM By: Gretta Cool, BSN, RN, CWS, Kim RN, BSN Entered By: Massie Kluver on 02/12/2022 15:38:14 Martha [name] ([id number]) -------------------------------------------------------------------------------- Vitals Details Patient Name: Martha [name] Date of Service: 02/12/2022 3:00 PM Medical Record Number: [id number] Patient Account Number: [id number] Date of Birth/Sex: 07-03-1956 (66 y.o. F) Treating RN: Cornell Barman Primary Care Alanee Ting: Denton Lank Other  Clinician: Massie Kluver Referring Kaniel Kiang: Denton Lank Treating April Carlyon/Extender: Skipper Cliche in Treatment: 7 Vital Signs Time Taken: 15:08 Temperature (F): 98.2 Height (in): 64 Pulse (bpm): 92 Weight (lbs):  236 Respiratory Rate (breaths/min): 18 Body Mass Index (BMI): 40.5 Blood Pressure (mmHg): 142/86 Reference Range: 80 - 120 mg / dl Electronic Signature(s) Signed: 02/12/2022 4:19:57 PM By: Massie Kluver Entered By: Massie Kluver on 02/12/2022 15:11:22

## 2022-07-19 ENCOUNTER — Other Ambulatory Visit: Payer: Self-pay | Admitting: Orthopedic Surgery

## 2022-07-25 ENCOUNTER — Other Ambulatory Visit: Payer: Self-pay

## 2022-07-25 ENCOUNTER — Encounter
Admission: RE | Admit: 2022-07-25 | Discharge: 2022-07-25 | Disposition: A | Discharge status: Home / Self Care | Payer: Medicare HMO | Source: Ambulatory Visit | Attending: Orthopedic Surgery | Admitting: Orthopedic Surgery

## 2022-07-25 VITALS — BP 137/86 | Resp 16 | Ht 64.0 in | Wt 241.4 lb

## 2022-07-25 DIAGNOSIS — Z01812 Encounter for preprocedural laboratory examination: Secondary | ICD-10-CM

## 2022-07-25 DIAGNOSIS — Z01818 Encounter for other preprocedural examination: Secondary | ICD-10-CM | POA: Diagnosis present

## 2022-07-25 HISTORY — DX: Other specified postprocedural states: Z98.890

## 2022-07-25 HISTORY — DX: Other complications of anesthesia, initial encounter: T88.59XA

## 2022-07-25 HISTORY — DX: Nausea with vomiting, unspecified: R11.2

## 2022-07-25 LAB — COMPREHENSIVE METABOLIC PANEL
ALT: 16 U/L (ref 0–44)
AST: 23 U/L (ref 15–41)
Albumin: 3.9 g/dL (ref 3.5–5.0)
Alkaline Phosphatase: 120 U/L (ref 38–126)
Anion gap: 9 (ref 5–15)
BUN: 13 mg/dL (ref 8–23)
CO2: 22 mmol/L (ref 22–32)
Calcium: 9.2 mg/dL (ref 8.9–10.3)
Chloride: 101 mmol/L (ref 98–111)
Creatinine, Ser: 0.67 mg/dL (ref 0.44–1.00)
GFR, Estimated: >60 mL/min (ref >60)
Glucose, Bld: 141 mg/dL — ABNORMAL HIGH (ref 70–99)
Potassium: 4 mmol/L (ref 3.5–5.1)
Sodium: 132 mmol/L — ABNORMAL LOW (ref 135–145)
Total Bilirubin: 0.5 mg/dL (ref 0.3–1.2)
Total Protein: 7.3 g/dL (ref 6.5–8.1)

## 2022-07-25 LAB — URINALYSIS, ROUTINE W REFLEX MICROSCOPIC
Bacteria, UA: NONE SEEN
Bilirubin Urine: NEGATIVE
Glucose, UA: >=500 mg/dL — AB
Hgb urine dipstick: NEGATIVE
Ketones, ur: NEGATIVE mg/dL
Leukocytes,Ua: NEGATIVE
Nitrite: NEGATIVE
Protein, ur: NEGATIVE mg/dL
Specific Gravity, Urine: 1.013 (ref 1.005–1.030)
pH: 5 (ref 5.0–8.0)

## 2022-07-25 LAB — CBC WITH DIFFERENTIAL/PLATELET
Abs Immature Granulocytes: 0.03 10*3/uL (ref 0.00–0.07)
Basophils Absolute: 0.1 10*3/uL (ref 0.0–0.1)
Basophils Relative: 1 %
Eosinophils Absolute: 0.1 10*3/uL (ref 0.0–0.5)
Eosinophils Relative: 1 %
HCT: 46.1 % — ABNORMAL HIGH (ref 36.0–46.0)
Hemoglobin: 15.1 g/dL — ABNORMAL HIGH (ref 12.0–15.0)
Immature Granulocytes: 0 %
Lymphocytes Relative: 27 %
Lymphs Abs: 2.9 10*3/uL (ref 0.7–4.0)
MCH: 28.7 pg (ref 26.0–34.0)
MCHC: 32.8 g/dL (ref 30.0–36.0)
MCV: 87.5 fL (ref 80.0–100.0)
Monocytes Absolute: 0.7 10*3/uL (ref 0.1–1.0)
Monocytes Relative: 7 %
Neutro Abs: 6.9 10*3/uL (ref 1.7–7.7)
Neutrophils Relative %: 64 %
Platelets: 353 10*3/uL (ref 150–400)
RBC: 5.27 MIL/uL — ABNORMAL HIGH (ref 3.87–5.11)
RDW: 13.9 % (ref 11.5–15.5)
WBC: 10.7 10*3/uL — ABNORMAL HIGH (ref 4.0–10.5)
nRBC: 0 % (ref 0.0–0.2)

## 2022-07-25 LAB — SURGICAL PCR SCREEN
MRSA, PCR: NEGATIVE
Staphylococcus aureus: POSITIVE — AB

## 2022-07-25 LAB — TYPE AND SCREEN
ABO/RH(D): O POS
Antibody Screen: NEGATIVE

## 2022-07-25 NOTE — Patient Instructions (Addendum)
Your procedure is scheduled on: 08/05/22 - Monday Report to the Registration Desk on the 1st floor of the [facility]. To find out your arrival time, please call [phone] between 1PM - 3PM on: 08/02/22 - Friday If your arrival time is 6:00 am, do not arrive prior to that time as the [facility] entrance doors do not open until 6:00 am.  REMEMBER: Instructions that are not followed completely may result in serious medical risk, up to and including death; or upon the discretion of your surgeon and anesthesiologist your surgery may need to be rescheduled.  Do not eat food after midnight the night before surgery.  No gum chewing, lozengers or hard candies.  You may however, drink CLEAR liquids up to 2 hours before you are scheduled to arrive for your surgery. Do not drink anything within 2 hours of your scheduled arrival time.  Type 1 and Type 2 diabetics should only drink water.  In addition, your doctor has ordered for you to drink the provided  Gatorade G2 Drinking this carbohydrate drink up to two hours before surgery helps to reduce insulin resistance and improve patient outcomes. Please complete drinking 2 hours prior to scheduled arrival time.  TAKE THESE MEDICATIONS THE MORNING OF SURGERY WITH A SIP OF WATER: NONE   HOLD empagliflozin (JARDIANCE) 3 days prior to your surgery beginning 08/02/22.  HOLD TRULICITY 3 ME/2.6ST SOPN 7 days prior to your surgery, last dose on 07/28/22, hold the 08/04/22 dose.   One week prior to surgery: hold beginning 07/28/22. Stop Anti-inflammatories (NSAIDS) such as Advil, Aleve, Ibuprofen, Motrin, Naproxen, Naprosyn and Aspirin based products such as Excedrin, Goodys Powder, BC Powder.  Stop ANY OVER THE COUNTER supplements until after surgery.  You may however, continue to take Tylenol if needed for pain up until the day of surgery.  No Alcohol for 24 hours before or after surgery.  No Smoking including e-cigarettes for 24 hours prior to  surgery.  No chewable tobacco products for at least 6 hours prior to surgery.  No nicotine patches on the day of surgery.  Do not use any "recreational" drugs for at least a week prior to your surgery.  Please be advised that the combination of cocaine and anesthesia may have negative outcomes, up to and including death. If you test positive for cocaine, your surgery will be cancelled.  On the morning of surgery brush your teeth with toothpaste and water, you may rinse your mouth with mouthwash if you wish, Do not swallow any toothpaste or mouthwash.  Use CHG Soap or wipes as directed on instruction sheet.  Do not wear jewelry, make-up, hairpins, clips or nail polish.  Do not wear lotions, powders, or perfumes.   Do not shave body from the neck down 48 hours prior to surgery just in case you cut yourself which could leave a site for infection.  Also, freshly shaved skin may become irritated if using the CHG soap.  Contact lenses, hearing aids and dentures may not be worn into surgery.  Do not bring valuables to the hospital. [facility] is not responsible for any missing/lost belongings or valuables.   Notify your doctor if there is any change in your medical condition (cold, fever, infection).  Wear comfortable clothing (specific to your surgery type) to the hospital.  After surgery, you can help prevent lung complications by doing breathing exercises.  Take deep breaths and cough every 1-2 hours. Your doctor may order a device called an Chiropodist to  help you take deep breaths. When coughing or sneezing, hold a pillow firmly against your incision with both hands. This is called "splinting." Doing this helps protect your incision. It also decreases belly discomfort.  If you are being admitted to the hospital overnight, leave your suitcase in the car. After surgery it may be brought to your room.  If you are being discharged the day of surgery, you will not be allowed to  drive home. You will need a responsible adult (18 years or older) to drive you home and stay with you that night.   If you are taking public transportation, you will need to have a responsible adult (18 years or older) with you. Please confirm with your physician that it is acceptable to use public transportation.   Please call the [facility] at [phone] if you have any questions about these instructions.  Surgery Visitation Policy:  Patients undergoing a surgery or procedure may have two family members or support persons with them as long as the person is not COVID-19 positive or experiencing its symptoms.   Inpatient Visitation:    Visiting hours are 7 a.m. to 8 p.m. Up to four visitors are allowed at one time in a patient room. The visitors may rotate out with other people during the day. One designated support person (adult) may remain overnight.  Due to an increase in RSV and influenza rates and associated hospitalizations, children ages 31 and under will not be able to visit patients in [facility]. Masks continue to be strongly recommended.

## 2022-07-29 NOTE — Progress Notes (Signed)
  Perioperative Services Pre-Admission/Anesthesia Testing    Date: 07/29/22  Name: Martha [name] MRN:   [id number]  Re: GLP-1 clearance and provider recommendations   Planned Surgical Procedure(s):    Case: [id number] Date/Time: 08/05/22 0715   Procedure: TOTAL KNEE ARTHROPLASTY (Left: Knee)   Anesthesia type: Choice   Pre-op diagnosis: Primary osteoarthritis of left knee M17.12   Location: [facility] OR ROOM 02 / [facility]   Surgeons: Steffanie Rainwater, MD   Clinical Notes:  Patient is scheduled for the above procedure with the indicated provider/surgeon. In review of her medication reconciliation it was noted that patient is on a prescribed GLP-1 medication. Per guidelines issued by the American Society of Anesthesiologists (ASA), it is recommended that these medications be held for 7 days prior to the patient undergoing any type of elective surgical procedure. The patient is taking the following GLP-1 medication:  '[]'$  SEMAGLUTIDE   '[]'$  EXENATIDE  '[]'$  LIRAGLUTIDE   '[]'$  LIXISENATIDE  '[x]'$  DULAGLUTIDE     '[]'$  OTHER GLP-1 medication: _______________  Reached out to prescribing provider Posey Pronto, MD) to make them aware of the guidelines from anesthesia. Given that this patient takes the prescribed GLP-1 medication for her  diabetes diagnosis, rather than for weight loss, recommendations from the prescribing provider were solicited. Prescribing provider made aware of the following so that informed decision/POC can be developed for this patient that may be taking medications belonging to these drug classes:  Oral GLP-1 medications will be held 1 day prior to surgery.  Injectable GLP-1 medications will be held 7 days prior to surgery.  Metformin is routinely held 48 hours prior to surgery due to renal concerns, potential need for contrasted imaging perioperatively, and the potential for tissue hypoxia leading to drug induced lactic acidosis.  All SGLT2i medications are held  72 hours prior to surgery as they can be associated with the increased potential for developing euglycemic diabetic ketoacidosis (EDKA).   Impression and Plan:  Martha [name] is on a prescribed GLP-1 medication, which induces the known side effect of decreased gastric emptying. Efforts are bring made to mitigate the risk of perioperative hyperglycemic events, as elevated blood glucose levels have been found to contribute to intra/postoperative complications. Additionally, hyperglycemic extremes can potentially necessitate the postponing of a patient's elective case in order to better optimize perioperative glycemic control, again with the aforementioned guidelines in place. With this in mind, recommendations have been sought from the prescribing provider, who has cleared patient to proceed with holding the prescribed GLP-1 as per the guidelines from the ASA.   Provider recommending: no further recommendations received from the prescribing provider.  Copy of signed clearance and recommendations placed on patient's chart for inclusion in their medical record and for review by the surgical/anesthetic team on the day of her procedure.   Honor Loh, MSN, APRN, FNP-C, CEN [facility]  Peri-operative Services Nurse Practitioner Phone: [phone] 07/29/22 3:39 PM  NOTE: This note has been prepared using Dragon dictation software. Despite my best ability to proofread, there is always the potential that unintentional transcriptional errors may still occur from this process.

## 2022-08-05 ENCOUNTER — Encounter: Payer: Self-pay | Admitting: Orthopedic Surgery

## 2022-08-05 ENCOUNTER — Other Ambulatory Visit: Payer: Self-pay

## 2022-08-05 ENCOUNTER — Encounter
Admission: RE | Disposition: A | Discharge status: Home Health | Payer: Self-pay | Source: Home / Self Care | Attending: Orthopedic Surgery

## 2022-08-05 ENCOUNTER — Observation Stay
Admission: RE | Admit: 2022-08-05 | Discharge: 2022-08-06 | Disposition: A | Discharge status: Home Health | Payer: Medicare HMO | Attending: Orthopedic Surgery | Admitting: Orthopedic Surgery

## 2022-08-05 ENCOUNTER — Ambulatory Visit: Payer: Medicare HMO | Admitting: Urgent Care

## 2022-08-05 ENCOUNTER — Observation Stay: Payer: Medicare HMO

## 2022-08-05 DIAGNOSIS — Z79899 Other long term (current) drug therapy: Secondary | ICD-10-CM | POA: Diagnosis not present

## 2022-08-05 DIAGNOSIS — I10 Essential (primary) hypertension: Secondary | ICD-10-CM | POA: Insufficient documentation

## 2022-08-05 DIAGNOSIS — M1712 Unilateral primary osteoarthritis, left knee: Principal | ICD-10-CM | POA: Insufficient documentation

## 2022-08-05 DIAGNOSIS — Z7984 Long term (current) use of oral hypoglycemic drugs: Secondary | ICD-10-CM | POA: Diagnosis not present

## 2022-08-05 DIAGNOSIS — Z7985 Long-term (current) use of injectable non-insulin antidiabetic drugs: Secondary | ICD-10-CM | POA: Insufficient documentation

## 2022-08-05 DIAGNOSIS — E119 Type 2 diabetes mellitus without complications: Secondary | ICD-10-CM | POA: Insufficient documentation

## 2022-08-05 DIAGNOSIS — E039 Hypothyroidism, unspecified: Secondary | ICD-10-CM | POA: Insufficient documentation

## 2022-08-05 DIAGNOSIS — Z8673 Personal history of transient ischemic attack (TIA), and cerebral infarction without residual deficits: Secondary | ICD-10-CM | POA: Insufficient documentation

## 2022-08-05 DIAGNOSIS — Z8541 Personal history of malignant neoplasm of cervix uteri: Secondary | ICD-10-CM | POA: Diagnosis not present

## 2022-08-05 DIAGNOSIS — Z01812 Encounter for preprocedural laboratory examination: Secondary | ICD-10-CM

## 2022-08-05 DIAGNOSIS — Z96651 Presence of right artificial knee joint: Secondary | ICD-10-CM | POA: Insufficient documentation

## 2022-08-05 HISTORY — PX: TOTAL KNEE ARTHROPLASTY: SHX125

## 2022-08-05 LAB — GLUCOSE, CAPILLARY
Glucose-Capillary: 148 mg/dL — ABNORMAL HIGH (ref 70–99)
Glucose-Capillary: 181 mg/dL — ABNORMAL HIGH (ref 70–99)
Glucose-Capillary: 281 mg/dL — ABNORMAL HIGH (ref 70–99)
Glucose-Capillary: 260 mg/dL — ABNORMAL HIGH (ref 70–99)

## 2022-08-05 SURGERY — ARTHROPLASTY, KNEE, TOTAL
Anesthesia: Spinal | Site: Knee | Laterality: Left

## 2022-08-05 MED ORDER — DEXAMETHASONE SODIUM PHOSPHATE 10 MG/ML IJ SOLN
8.0000 mg | Freq: Once | INTRAMUSCULAR | Status: AC
  Administered 2022-08-05: 8 mg via INTRAVENOUS

## 2022-08-05 MED ORDER — SODIUM CHLORIDE 0.9 % IR SOLN
Status: DC | PRN
  Administered 2022-08-05: 3000 mL

## 2022-08-05 MED ORDER — ONDANSETRON HCL 4 MG/2ML IJ SOLN: 4.0000 mg | Freq: Four times a day (QID) | INTRAMUSCULAR | Status: DC | PRN

## 2022-08-05 MED ORDER — FAMOTIDINE 20 MG PO TABS
ORAL_TABLET | ORAL | Status: AC
  Administered 2022-08-05: 20 mg via ORAL
  Filled 2022-08-05: qty 1

## 2022-08-05 MED ORDER — ENOXAPARIN SODIUM 30 MG/0.3ML IJ SOSY
30.0000 mg | PREFILLED_SYRINGE | Freq: Two times a day (BID) | INTRAMUSCULAR | Status: DC
  Administered 2022-08-06: 30 mg via SUBCUTANEOUS
  Filled 2022-08-05: qty 0.3

## 2022-08-05 MED ORDER — FENTANYL CITRATE (PF) 100 MCG/2ML IJ SOLN
INTRAMUSCULAR | Status: AC
  Filled 2022-08-05: qty 2

## 2022-08-05 MED ORDER — INSULIN ASPART 100 UNIT/ML IJ SOLN
0.0000 [IU] | Freq: Three times a day (TID) | INTRAMUSCULAR | Status: DC
  Administered 2022-08-05: 8 [IU] via SUBCUTANEOUS
  Administered 2022-08-06: 5 [IU] via SUBCUTANEOUS
  Administered 2022-08-06: 8 [IU] via SUBCUTANEOUS
  Filled 2022-08-05 (×3): qty 1

## 2022-08-05 MED ORDER — CEFAZOLIN SODIUM-DEXTROSE 2-4 GM/100ML-% IV SOLN
2.0000 g | Freq: Four times a day (QID) | INTRAVENOUS | Status: AC
  Administered 2022-08-05 – 2022-08-06 (×2): 2 g via INTRAVENOUS
  Filled 2022-08-05 (×2): qty 100

## 2022-08-05 MED ORDER — PROPOFOL 10 MG/ML IV BOLUS
INTRAVENOUS | Status: DC | PRN
  Administered 2022-08-05: 30 mg via INTRAVENOUS

## 2022-08-05 MED ORDER — ACETAMINOPHEN 160 MG/5ML PO SOLN: 325.0000 mg | ORAL | Status: DC | PRN

## 2022-08-05 MED ORDER — CEFAZOLIN SODIUM-DEXTROSE 2-4 GM/100ML-% IV SOLN
2.0000 g | INTRAVENOUS | Status: AC
  Administered 2022-08-05: 2 g via INTRAVENOUS

## 2022-08-05 MED ORDER — GABAPENTIN 400 MG PO CAPS
800.0000 mg | ORAL_CAPSULE | Freq: Every day | ORAL | Status: DC
  Administered 2022-08-05: 800 mg via ORAL
  Filled 2022-08-05: qty 2

## 2022-08-05 MED ORDER — HYDROCODONE-ACETAMINOPHEN 5-325 MG PO TABS
1.0000 | ORAL_TABLET | ORAL | Status: DC | PRN
  Administered 2022-08-05: 2 via ORAL
  Administered 2022-08-05: 1 via ORAL
  Filled 2022-08-05: qty 2

## 2022-08-05 MED ORDER — SPIRONOLACTONE 25 MG PO TABS
50.0000 mg | ORAL_TABLET | Freq: Every day | ORAL | Status: DC
  Administered 2022-08-05 – 2022-08-06 (×2): 50 mg via ORAL
  Filled 2022-08-05 (×2): qty 2

## 2022-08-05 MED ORDER — BUPIVACAINE-EPINEPHRINE (PF) 0.25% -1:200000 IJ SOLN
INTRAMUSCULAR | Status: AC
  Filled 2022-08-05: qty 30

## 2022-08-05 MED ORDER — ONDANSETRON HCL 4 MG/2ML IJ SOLN: 4.0000 mg | Freq: Once | INTRAMUSCULAR | Status: DC | PRN

## 2022-08-05 MED ORDER — SODIUM CHLORIDE FLUSH 0.9 % IV SOLN
INTRAVENOUS | Status: AC
  Filled 2022-08-05: qty 20

## 2022-08-05 MED ORDER — PANTOPRAZOLE SODIUM 40 MG PO TBEC
40.0000 mg | DELAYED_RELEASE_TABLET | Freq: Every day | ORAL | Status: DC
  Administered 2022-08-05 – 2022-08-06 (×2): 40 mg via ORAL
  Filled 2022-08-05 (×2): qty 1

## 2022-08-05 MED ORDER — BUPIVACAINE LIPOSOME 1.3 % IJ SUSP
INTRAMUSCULAR | Status: AC
  Filled 2022-08-05: qty 20

## 2022-08-05 MED ORDER — CHLORHEXIDINE GLUCONATE 0.12 % MT SOLN
OROMUCOSAL | Status: AC
  Administered 2022-08-05: 15 mL via OROMUCOSAL
  Filled 2022-08-05: qty 15

## 2022-08-05 MED ORDER — PHENOL 1.4 % MT LIQD: 1.0000 | OROMUCOSAL | Status: DC | PRN

## 2022-08-05 MED ORDER — TRAMADOL HCL 50 MG PO TABS: 50.0000 mg | ORAL_TABLET | Freq: Four times a day (QID) | ORAL | Status: DC | PRN

## 2022-08-05 MED ORDER — MENTHOL 3 MG MT LOZG: 1.0000 | LOZENGE | OROMUCOSAL | Status: DC | PRN

## 2022-08-05 MED ORDER — SODIUM CHLORIDE (PF) 0.9 % IJ SOLN
INTRAMUSCULAR | Status: DC | PRN
  Administered 2022-08-05: 80 mL

## 2022-08-05 MED ORDER — TRANEXAMIC ACID 1000 MG/10ML IV SOLN
INTRAVENOUS | Status: AC
  Filled 2022-08-05: qty 10

## 2022-08-05 MED ORDER — SODIUM CHLORIDE 0.9 % IV SOLN: INTRAVENOUS | Status: DC

## 2022-08-05 MED ORDER — DEXAMETHASONE SODIUM PHOSPHATE 10 MG/ML IJ SOLN
INTRAMUSCULAR | Status: AC
  Filled 2022-08-05: qty 1

## 2022-08-05 MED ORDER — FAMOTIDINE 20 MG PO TABS: 20.0000 mg | ORAL_TABLET | Freq: Once | ORAL | Status: AC

## 2022-08-05 MED ORDER — HYDROCODONE-ACETAMINOPHEN 5-325 MG PO TABS
ORAL_TABLET | ORAL | Status: AC
  Filled 2022-08-05: qty 1

## 2022-08-05 MED ORDER — MORPHINE SULFATE (PF) 2 MG/ML IV SOLN: 0.5000 mg | INTRAVENOUS | Status: DC | PRN

## 2022-08-05 MED ORDER — FENTANYL CITRATE (PF) 100 MCG/2ML IJ SOLN
25.0000 ug | INTRAMUSCULAR | Status: DC | PRN
  Administered 2022-08-05: 25 ug via INTRAVENOUS

## 2022-08-05 MED ORDER — INSULIN ASPART 100 UNIT/ML IJ SOLN
0.0000 [IU] | Freq: Every day | INTRAMUSCULAR | Status: DC
  Administered 2022-08-05: 3 [IU] via SUBCUTANEOUS
  Filled 2022-08-05: qty 1

## 2022-08-05 MED ORDER — TRANEXAMIC ACID-NACL 1000-0.7 MG/100ML-% IV SOLN
INTRAVENOUS | Status: AC
  Filled 2022-08-05: qty 100

## 2022-08-05 MED ORDER — PHENYLEPHRINE HCL-NACL 20-0.9 MG/250ML-% IV SOLN
INTRAVENOUS | Status: DC | PRN
  Administered 2022-08-05: 30 ug/min via INTRAVENOUS

## 2022-08-05 MED ORDER — DOCUSATE SODIUM 100 MG PO CAPS
100.0000 mg | ORAL_CAPSULE | Freq: Two times a day (BID) | ORAL | Status: DC
  Administered 2022-08-05 – 2022-08-06 (×2): 100 mg via ORAL
  Filled 2022-08-05 (×2): qty 1

## 2022-08-05 MED ORDER — METOCLOPRAMIDE HCL 5 MG/ML IJ SOLN: 5.0000 mg | Freq: Three times a day (TID) | INTRAMUSCULAR | Status: DC | PRN

## 2022-08-05 MED ORDER — CHLORHEXIDINE GLUCONATE 0.12 % MT SOLN: 15.0000 mL | Freq: Once | OROMUCOSAL | Status: AC

## 2022-08-05 MED ORDER — PROPOFOL 1000 MG/100ML IV EMUL
INTRAVENOUS | Status: AC
  Filled 2022-08-05: qty 200

## 2022-08-05 MED ORDER — CEFAZOLIN SODIUM-DEXTROSE 2-4 GM/100ML-% IV SOLN
INTRAVENOUS | Status: AC
  Filled 2022-08-05: qty 100

## 2022-08-05 MED ORDER — HYDROCODONE-ACETAMINOPHEN 7.5-325 MG PO TABS: 1.0000 | ORAL_TABLET | Freq: Once | ORAL | Status: DC | PRN

## 2022-08-05 MED ORDER — ONDANSETRON HCL 4 MG PO TABS: 4.0000 mg | ORAL_TABLET | Freq: Four times a day (QID) | ORAL | Status: DC | PRN

## 2022-08-05 MED ORDER — LOSARTAN POTASSIUM 50 MG PO TABS
100.0000 mg | ORAL_TABLET | Freq: Every day | ORAL | Status: DC
  Administered 2022-08-05 – 2022-08-06 (×2): 100 mg via ORAL
  Filled 2022-08-05 (×2): qty 2

## 2022-08-05 MED ORDER — SODIUM CHLORIDE 0.9 % IV SOLN: INTRAVENOUS | Status: DC | PRN

## 2022-08-05 MED ORDER — ONDANSETRON HCL 4 MG/2ML IJ SOLN
INTRAMUSCULAR | Status: DC | PRN
  Administered 2022-08-05: 4 mg via INTRAVENOUS

## 2022-08-05 MED ORDER — MIDAZOLAM HCL 2 MG/2ML IJ SOLN
INTRAMUSCULAR | Status: AC
  Filled 2022-08-05: qty 2

## 2022-08-05 MED ORDER — 0.9 % SODIUM CHLORIDE (POUR BTL) OPTIME
TOPICAL | Status: DC | PRN
  Administered 2022-08-05: 500 mL

## 2022-08-05 MED ORDER — LIDOCAINE HCL (CARDIAC) PF 100 MG/5ML IV SOSY
PREFILLED_SYRINGE | INTRAVENOUS | Status: DC | PRN
  Administered 2022-08-05: 40 mg via INTRAVENOUS

## 2022-08-05 MED ORDER — MIDAZOLAM HCL 5 MG/5ML IJ SOLN
INTRAMUSCULAR | Status: DC | PRN
  Administered 2022-08-05 (×2): 1 mg via INTRAVENOUS

## 2022-08-05 MED ORDER — PROPOFOL 10 MG/ML IV BOLUS
INTRAVENOUS | Status: AC
  Filled 2022-08-05: qty 20

## 2022-08-05 MED ORDER — TRANEXAMIC ACID-NACL 1000-0.7 MG/100ML-% IV SOLN
1000.0000 mg | INTRAVENOUS | Status: AC
  Administered 2022-08-05: 1000 mg via INTRAVENOUS

## 2022-08-05 MED ORDER — DROPERIDOL 2.5 MG/ML IJ SOLN: 0.6250 mg | Freq: Once | INTRAMUSCULAR | Status: DC | PRN

## 2022-08-05 MED ORDER — BUPIVACAINE IN DEXTROSE 0.75-8.25 % IT SOLN
INTRATHECAL | Status: DC | PRN
  Administered 2022-08-05: 1.4 mL via INTRATHECAL

## 2022-08-05 MED ORDER — PROPOFOL 500 MG/50ML IV EMUL
INTRAVENOUS | Status: DC | PRN
  Administered 2022-08-05: 100 ug/kg/min via INTRAVENOUS

## 2022-08-05 MED ORDER — KETOROLAC TROMETHAMINE 15 MG/ML IJ SOLN
INTRAMUSCULAR | Status: AC
  Filled 2022-08-05: qty 1

## 2022-08-05 MED ORDER — ACETAMINOPHEN 325 MG PO TABS: 325.0000 mg | ORAL_TABLET | ORAL | Status: DC | PRN

## 2022-08-05 MED ORDER — ACETAMINOPHEN 500 MG PO TABS
1000.0000 mg | ORAL_TABLET | Freq: Three times a day (TID) | ORAL | Status: DC
  Administered 2022-08-05 – 2022-08-06 (×3): 1000 mg via ORAL
  Filled 2022-08-05 (×3): qty 2

## 2022-08-05 MED ORDER — MEPERIDINE HCL 25 MG/ML IJ SOLN: 6.2500 mg | INTRAMUSCULAR | Status: DC | PRN

## 2022-08-05 MED ORDER — METOCLOPRAMIDE HCL 5 MG PO TABS: 5.0000 mg | ORAL_TABLET | Freq: Three times a day (TID) | ORAL | Status: DC | PRN

## 2022-08-05 MED ORDER — KETOROLAC TROMETHAMINE 15 MG/ML IJ SOLN
7.5000 mg | Freq: Four times a day (QID) | INTRAMUSCULAR | Status: AC
  Administered 2022-08-05 – 2022-08-06 (×4): 7.5 mg via INTRAVENOUS
  Filled 2022-08-05 (×3): qty 1

## 2022-08-05 MED ORDER — ORAL CARE MOUTH RINSE: 15.0000 mL | Freq: Once | OROMUCOSAL | Status: AC

## 2022-08-05 SURGICAL SUPPLY — 76 items
ARTISURF 14M VE L 6-9 EF KNEE (Knees) IMPLANT
BLADE PATELLA REAM PILOT HOLE (MISCELLANEOUS) IMPLANT
BLADE SAW 90X13X1.19 OSCILLAT (BLADE) IMPLANT
BLADE SAW SAG 25X90X1.19 (BLADE) ×1 IMPLANT
BLADE SAW SAG 29X58X.64 (BLADE) ×1 IMPLANT
BNDG ELASTIC 6X5.8 VLCR STR LF (GAUZE/BANDAGES/DRESSINGS) ×1 IMPLANT
BOWL CEMENT MIX W/ADAPTER (MISCELLANEOUS) ×1 IMPLANT
CEMENT BONE R 1X40 (Cement) ×2 IMPLANT
CHLORAPREP W/TINT 26 (MISCELLANEOUS) ×2 IMPLANT
COOLER POLAR GLACIER W/PUMP (MISCELLANEOUS) ×1 IMPLANT
DERMABOND ADVANCED .7 DNX12 (GAUZE/BANDAGES/DRESSINGS) ×1 IMPLANT
DRAPE 3/4 80X56 (DRAPES) ×1 IMPLANT
DRAPE INCISE IOBAN 66X60 STRL (DRAPES) ×1 IMPLANT
DRSG MEPILEX SACRM 8.7X9.8 (GAUZE/BANDAGES/DRESSINGS) ×1 IMPLANT
DRSG OPSITE POSTOP 4X10 (GAUZE/BANDAGES/DRESSINGS) IMPLANT
DRSG OPSITE POSTOP 4X8 (GAUZE/BANDAGES/DRESSINGS) IMPLANT
ELECT CAUTERY BLADE 6.4 (BLADE) ×1 IMPLANT
ELECT REM PT RETURN 9FT ADLT (ELECTROSURGICAL) ×1
ELECTRODE REM PT RTRN 9FT ADLT (ELECTROSURGICAL) ×1 IMPLANT
FEMUR  CMT CCR STD SZ8 L KNEE (Knees) ×1 IMPLANT
FEMUR CMT CCR STD SZ8 L KNEE (Knees) ×1 IMPLANT
FEMUR CMTD CCR STD SZ8 L KNEE (Knees) IMPLANT
GLOVE BIO SURGEON STRL SZ8 (GLOVE) ×1 IMPLANT
GLOVE BIOGEL PI IND STRL 8 (GLOVE) ×1 IMPLANT
GLOVE PI ORTHO PRO STRL 7.5 (GLOVE) ×2 IMPLANT
GLOVE PI ORTHO PRO STRL SZ8 (GLOVE) ×2 IMPLANT
GOWN STRL REUS W/ TWL LRG LVL3 (GOWN DISPOSABLE) ×1 IMPLANT
GOWN STRL REUS W/ TWL XL LVL3 (GOWN DISPOSABLE) ×2 IMPLANT
GOWN STRL REUS W/TWL LRG LVL3 (GOWN DISPOSABLE) ×1
GOWN STRL REUS W/TWL XL LVL3 (GOWN DISPOSABLE) ×2
HANDLE YANKAUER SUCT OPEN TIP (MISCELLANEOUS) IMPLANT
HDLS TROCR DRIL PIN KNEE 75 (PIN) ×1
HOLDER FOLEY CATH W/STRAP (MISCELLANEOUS) ×1 IMPLANT
HOOD PEEL AWAY T7 (MISCELLANEOUS) ×2 IMPLANT
IV NS IRRIG 3000ML ARTHROMATIC (IV SOLUTION) ×1 IMPLANT
KIT TURNOVER KIT A (KITS) ×1 IMPLANT
MANIFOLD NEPTUNE II (INSTRUMENTS) ×1 IMPLANT
MARKER SKIN DUAL TIP RULER LAB (MISCELLANEOUS) ×1 IMPLANT
MAT ABSORB  FLUID 56X50 GRAY (MISCELLANEOUS) ×1
MAT ABSORB FLUID 56X50 GRAY (MISCELLANEOUS) ×1 IMPLANT
NDL HYPO 21X1.5 SAFETY (NEEDLE) ×1 IMPLANT
NDL SAFETY ECLIP 18X1.5 (MISCELLANEOUS) ×1 IMPLANT
NEEDLE HYPO 21X1.5 SAFETY (NEEDLE) ×1 IMPLANT
NS IRRIG 500ML POUR BTL (IV SOLUTION) ×1 IMPLANT
PACK TOTAL KNEE (MISCELLANEOUS) ×1 IMPLANT
PAD WRAPON POLAR KNEE (MISCELLANEOUS) ×1 IMPLANT
PENCIL SMOKE EVACUATOR (MISCELLANEOUS) ×1 IMPLANT
PIN DRILL HDLS TROCAR 75 4PK (PIN) IMPLANT
PULSAVAC PLUS IRRIG FAN TIP (DISPOSABLE) ×1
Patella reaming blade Lawson 71203 00597909532 IMPLANT
SCREW FEMALE HEX FIX 25X2.5 (ORTHOPEDIC DISPOSABLE SUPPLIES) IMPLANT
SCREW HEX HEADED 3.5X27 DISP (ORTHOPEDIC DISPOSABLE SUPPLIES) IMPLANT
SLEEVE SCD COMPRESS KNEE MED (STOCKING) ×1 IMPLANT
SOLUTION IRRIG SURGIPHOR (IV SOLUTION) ×1 IMPLANT
STEM POLY PAT PLY 32M KNEE (Knees) IMPLANT
STEM TIB ST PERS 14+30 (Stem) IMPLANT
STEM TIBIA 5 DEG SZ E L KNEE (Knees) IMPLANT
SUT DVC 2 QUILL PDO  T11 36X36 (SUTURE) ×1
SUT DVC 2 QUILL PDO T11 36X36 (SUTURE) ×1 IMPLANT
SUT QUILL MONODERM 3-0 PS-2 (SUTURE) ×1 IMPLANT
SUT VIC AB 0 CT1 36 (SUTURE) ×1 IMPLANT
SUT VIC AB 2-0 CT2 27 (SUTURE) ×2 IMPLANT
SUT VICRYL 1-0 27IN ABS (SUTURE) ×1
SUTURE VICRYL 1-0 27IN ABS (SUTURE) ×1 IMPLANT
SYR 10ML LL (SYRINGE) ×1 IMPLANT
SYR 30ML LL (SYRINGE) ×2 IMPLANT
SYR 3ML LL SCALE MARK (SYRINGE) ×1 IMPLANT
TAPE CLOTH 3X10 WHT NS LF (GAUZE/BANDAGES/DRESSINGS) ×1 IMPLANT
TIBIA STEM 5 DEG SZ E L KNEE (Knees) ×1 IMPLANT
TIP FAN IRRIG PULSAVAC PLUS (DISPOSABLE) ×1 IMPLANT
TOWEL OR 17X26 4PK STRL BLUE (TOWEL DISPOSABLE) IMPLANT
TRAP FLUID SMOKE EVACUATOR (MISCELLANEOUS) ×1 IMPLANT
TRAY FOLEY SLVR 16FR LF STAT (SET/KITS/TRAYS/PACK) ×1 IMPLANT
TUBE KAMVAC SUCTION (TUBING) ×1 IMPLANT
WATER STERILE IRR 1000ML POUR (IV SOLUTION) ×1 IMPLANT
WRAPON POLAR PAD KNEE (MISCELLANEOUS) ×1

## 2022-08-05 NOTE — H&P (Signed)
History of Present Illness: The patient is an 66 y.o. female seen in clinic today for evaluation of her left knee. Patient reports she has had pain in her knee for the last 20 years. She reports she has undergone multiple series of injections of steroids and gels without relief she reports the pain in her knee has become so bad that it is become almost numb. She reports the knee is more of a functional issue at this point due to the severe degenerative changes with locking catching and inability to range her knee for daily activities. It functionally limits her ability to walk or do normal activities. She takes methocarbamol and naproxen for the pain. She denies any trauma to her knee. The patient denies fevers, chills, numbness, tingling, shortness of breath, chest pain, recent illness, or any trauma. She recently had new diabetes testing and reports that her numbers much improved but would not have access to those records. The daughter is translating at the request of the patient. Primary language is Spanish. She is a non-smoker. Current BMI is 41  Past Medical History: Past Medical History: Diagnosis Date Cancer (CMS-HCC) uterine Chickenpox DDD (degenerative disc disease), lumbar Hypertension Hypothyroidism Obesity Vitamin D deficiency  Past Surgical History: Past Surgical History: Procedure Laterality Date ARTHROPLASTY TOTAL KNEE Right 01/17/2022 By Dr. Rudene Christians ABDOMINAL HYSTERECTOMY bladder surgery right breast tumor  Past Family History: Family History Problem Relation Age of Onset Cancer Mother Rectal cancer Father  Medications: Current Outpatient Medications Ordered in Epic Medication Sig Dispense Refill empagliflozin (JARDIANCE) 25 mg tablet Take 1 tablet (10 mg total) by mouth once daily gabapentin (NEURONTIN) 800 MG tablet Take 1 tablet by mouth 2 (two) times daily HYDROcodone-acetaminophen (NORCO) 5-325 mg tablet Take 1 tablet by mouth every 4 (four) hours as  needed lidocaine (LIDODERM) 5 % patch Place 1 patch onto the skin daily losartan (COZAAR) 100 MG tablet Take 100 mg by mouth once daily methocarbamoL (ROBAXIN) 500 MG tablet Take 500 mg by mouth 4 (four) times daily naproxen (NAPROSYN) 500 MG tablet Take 500 mg by mouth 2 (two) times daily. spironolactone (ALDACTONE) 50 MG tablet Take 50 mg by mouth 2 (two) times daily traMADoL (ULTRAM) 50 mg tablet Take 50 mg by mouth every 6 (six) hours as needed TRULICITY 1.5 YB/0.1 mL subcutaneous pen injector Inject 1.5 mLs subcutaneously once daily  No current Epic-ordered facility-administered medications on file.  Allergies: Allergies Allergen Reactions Atorvastatin Unknown Bactrim [Sulfamethoxazole-Trimethoprim] Dizziness Hot and Cold Sweats and Tongue Numbness Pravastatin Other (See Comments) chest inflammation Hydrochlorothiazide Other (See Comments) Chest pain Metoprolol Other (See Comments) Other reaction(s): blood per rectum  Diclofenac Itching and Rash   Review of Systems: A comprehensive 14 point ROS was performed, reviewed, and the pertinent orthopaedic findings are documented in the HPI.  Physical Exam: General/Constitutional: No apparent distress: well-nourished and well developed. Eyes: Pupils equal, round with synchronous movement. Respiratory: "Non-labored breathing. Cardiac: Heart rate is regular. Integumentary: No impressive skin lesions present, except as noted in detailed exam. Neuro/Psych: Normal mood and affect, oriented to person, place and time.  Comprehensive Knee Exam: Gait Antalgic and stiff on the left with a cane Alignment Neutral  Inspection Right Left Skin Normal appearance with no obvious deformity. No ecchymosis or erythema. Healed midline incision Obvious knee deformity. No ecchymosis or erythema. Soft Tissue No focal soft tissue swelling No focal soft tissue swelling Quad Atrophy None None  Palpation Left Tenderness No peripatellar, patellar  tendon, quad tendon, medial/lateral joint line pain Tender over the  medial joint line Crepitus No patellofemoral or tibiofemoral crepitus + patellofemoral and tibiofemoral crepitus Effusion None None  Range of Motion Left Flexion Normal AROM 5-90 Extension 5 degree flexion contracture  Ligamentous Exam Left Lachman Normal  Valgus 0 Normal  Valgus 30 Normal  Varus 0 Normal  Varus 30 Normal  Anterior Drawer Normal  Posterior Drawer Normal   Neurovascular Right Left Quadriceps Strength 5/5 Hamstring Strength 5/5  Hip Abductor Strength 4/5  Distal Motor Normal  Distal Sensory Normal light touch sensation  Distal Pulses Normal    Imaging Studies: I have reviewed AP, lateral,sunrise, and flexed PA weight bearing knee X-rays (4 views) of the left knee ordered and taken today in the office show severe degenerative changes with complete tricompartmental joint space eradication with bone-on-bone articulation in all 3 compartments with bony erosion of the medial tibial plateau. There is massive osteophyte formation medial laterally and all around the patella. There is subchondral cyst formation and sclerosis. AP, sunrise, and flexed PA of the right knee also show status post total knee replacement with revision style stemmed components in appropriate appearing position without any evidence of peri-implant loosening or fracture.  Assessment: ICD-10-CM 1. Primary osteoarthritis of left knee M17.12   Plan: Martha [name] is a 66 year old female who presents with left knee bone on bone arthritis. Based upon the patient's continued symptoms and failure to respond to conservative treatment, I have recommended a total knee replacement for this patient. A long discussion took place with the patient describing what a total joint replacement is and what the procedure would entail. A knee model, similar to the implants that will be used during the operation, was utilized to demonstrate the implants. Choices of  implant manufactures were discussed and reviewed. The ability to secure the implant utilizing cement or cementless (press fit) fixation was discussed. The approach and exposure was discussed.  The hospitalization and post-operative care and rehabilitation were also discussed. The use of perioperative antibiotics and DVT prophylaxis were discussed. The risk, benefits and alternatives to a surgical intervention were discussed at length with the patient. The patient was also advised of risks related to the medical comorbidities and elevated body mass index (BMI). While the patient's BMI is elevated she carries very little of her weight over her knee. A lengthy discussion took place to review the most common complications including but not limited to: stiffness, loss of function, complex regional pain syndrome, deep vein thrombosis, pulmonary embolus, heart attack, stroke, infection, wound breakdown, numbness, damage to nerves, tendon,muscles, arteries or other blood vessels, death and other possible complications from anesthesia. The patient was told that we will take steps to minimize these risks by using sterile technique, antibiotics and DVT prophylaxis when appropriate and follow the patient postoperatively in the office setting to monitor progress. The possibility of recurrent pain, no improvement in pain and actual worsening of pain were also discussed with the patient.  The discharge plan of care focused on the patient going home following surgery. The patient was encouraged to make the necessary arrangements to have someone stay with them when they are discharged home.  The benefits of surgery were discussed with the patient including the potential for improving the patient's current clinical condition through operative intervention. Alternatives to surgical intervention including continued conservative management were also discussed in detail. All questions were answered to the satisfaction of the  patient. The patient participated and agreed to the plan of care as well as the use of the recommended implants for their  total knee replacement surgery. An information packet was given to the patient to review prior to surgery.  Patient will need medical clearance for the surgery. Will obtain a new hemoglobin A1c to evaluate current status on diabetes. If A1c is less than 8.0 we will plan to proceed with surgery. I advised the patient that I want her weight to be the same or lower than it is today at her presurgical visit in order to proceed with surgery. We may need to use revision components due to bone loss in this case primarily. All questions answered the patient agrees to the above plan.  Patient seen and examined on day of surgery, history and physical updated accordingly. Patient received medical clearance. New HgbA1c is now 7.3 Agree with plan to proceed with left total knee arthroplasty  Steffanie Rainwater, MD [facility] [place],  [id number] Phone: [phone]

## 2022-08-05 NOTE — Interval H&P Note (Signed)
History and Physical Interval Note:  08/05/2022 7:22 AM  Martha [name]  has presented today for surgery, with the diagnosis of Primary osteoarthritis of left knee M17.12.  The various methods of treatment have been discussed with the patient and family. After consideration of risks, benefits and other options for treatment, the patient has consented to  Procedure(s): TOTAL KNEE ARTHROPLASTY (Left) as a surgical intervention.  The patient's history has been reviewed, patient examined, no change in status, stable for surgery.  I have reviewed the patient's chart and labs.  Questions were answered to the patient's satisfaction.     Steffanie Rainwater

## 2022-08-05 NOTE — Transfer of Care (Signed)
Immediate Anesthesia Transfer of Care Note  Patient: Martha [name]  Procedure(s) Performed: TOTAL KNEE ARTHROPLASTY (Left: Knee)  Patient Location: PACU  Anesthesia Type:MAC and Spinal  Level of Consciousness: awake, alert , and oriented  Airway & Oxygen Therapy: Patient Spontanous Breathing and Patient connected to face mask oxygen  Post-op Assessment: Report given to RN and Post -op Vital signs reviewed and stable  Post vital signs: stable  Last Vitals:  Vitals Value Taken Time  BP 100/69 08/05/22 1006  Temp    Pulse 98 08/05/22 1010  Resp 30 08/05/22 1010  SpO2 100 % 08/05/22 1010  Vitals shown include unvalidated device data.  Last Pain:  Vitals:   08/05/22 0643  TempSrc: Temporal  PainSc: 0-No pain         Complications: No notable events documented.

## 2022-08-05 NOTE — Evaluation (Signed)
Physical Therapy Evaluation Patient Details Name: Martha [name] MRN: [id number] DOB: 01/23/1956 Today's Date: 08/05/2022  History of Present Illness  Pt is a 66 y.o. female s/p L TKA 08/05/22. WBAT.  Clinical Impression  Pt admitted with above diagnosis. Pt received upright in bed agreeable to PT services. Utilizing spanish interpreter Felix Ahmadi. Pt reports being indep with ADL's/IADL's prior to admission. PT educating pt on WB precautions, safe placement of L limb to prevent knee joint contracture prior to mobility.   To date, pt reports need to urinate. Pt mod-I with bed mobility to EOB with bed features with daughter entering room. Pt requiring supervision for STS to RW with VC"s for safe hand placement. Pt able to ambulate to toilet in bathroom with L antalgic gait but good stability using RW with correct use initially. Pt indep with toileting and perihygiene. Pt returning to recliner on other side of the room totaling ~40' of gait. Min to mod VC's for safe RW proximity as pt keeps RW outside of BOS. Fair carryover after cues. Pt seated in recliner with L knee placed in extension and polar care donned. All needs in reach. RN notified of pain levels and current functional capacity. Pending progression of gait and stair training, anticipate pt will be safe to d/c home with [facility] PT services and daughter support. Pt currently with functional limitations due to the deficits listed below (see PT Problem List). Pt will benefit from skilled PT to increase their independence and safety with mobility to allow discharge to the venue listed below.      Recommendations for follow up therapy are one component of a multi-disciplinary discharge planning process, led by the attending physician.  Recommendations may be updated based on patient status, additional functional criteria and insurance authorization.  Follow Up Recommendations Home health PT      Assistance Recommended at Discharge Intermittent  Supervision/Assistance  Patient can return home with the following  A little help with walking and/or transfers;Assist for transportation;A little help with bathing/dressing/bathroom;Help with stairs or ramp for entrance    Equipment Recommendations BSC/3in1  Recommendations for Other Services       Functional Status Assessment Patient has had a recent decline in their functional status and demonstrates the ability to make significant improvements in function in a reasonable and predictable amount of time.     Precautions / Restrictions Precautions Precautions: Knee;Fall Precaution Booklet Issued: Yes (comment) Restrictions Weight Bearing Restrictions: Yes LLE Weight Bearing: Weight bearing as tolerated      Mobility  Bed Mobility Overal bed mobility: Modified Independent             General bed mobility comments: bed features Patient Response: Cooperative  Transfers Overall transfer level: Needs assistance Equipment used: Rolling walker (2 wheels) Transfers: Sit to/from Stand Sit to Stand: Supervision           General transfer comment: VC's for hand placement    Ambulation/Gait Ambulation/Gait assistance: Min guard Gait Distance (Feet): 40 Feet Assistive device: Rolling walker (2 wheels) Gait Pattern/deviations: Step-to pattern, Decreased step length - right, Decreased stance time - left       General Gait Details: Ambulates with RW outside BOS. VC's to keep RW closer. Mild L sided antalgic gait.  Stairs            Wheelchair Mobility    Modified Rankin (Stroke Patients Only)       Balance Overall balance assessment: Needs assistance Sitting-balance support: Bilateral upper extremity supported, Feet supported Sitting balance-Leahy  Scale: Good     Standing balance support: Bilateral upper extremity supported Standing balance-Leahy Scale: Fair Standing balance comment: RW support                             Pertinent  Vitals/Pain Pain Assessment Pain Assessment: Faces Faces Pain Scale: Hurts a little bit Pain Location: L knee Pain Descriptors / Indicators: Aching, Discomfort Pain Intervention(s): Limited activity within patient's tolerance, Monitored during session, Repositioned, Ice applied, Patient requesting pain meds-RN notified    Home Living Family/patient expects to be discharged to:: Private residence Living Arrangements: Children Available Help at Discharge: Family;Available 24 hours/day Type of Home: House Home Access: Stairs to enter Entrance Stairs-Rails: Psychiatric nurse of Steps: 3-4   Home Layout: One level Home Equipment: Conservation officer, nature (2 wheels);Wheelchair - manual Additional Comments: reports BSC is broken and needs a new one    Prior Function Prior Level of Function : Independent/Modified Independent             Mobility Comments: Pt.  was independent prior to surgery ADLs Comments: Independent     Hand Dominance        Extremity/Trunk Assessment   Upper Extremity Assessment Upper Extremity Assessment: Overall WFL for tasks assessed    Lower Extremity Assessment Lower Extremity Assessment: Generalized weakness;LLE deficits/detail LLE Deficits / Details: L TKA LLE Sensation: WNL    Cervical / Trunk Assessment Cervical / Trunk Assessment: Normal  Communication      Cognition Arousal/Alertness: Awake/alert Behavior During Therapy: WFL for tasks assessed/performed Overall Cognitive Status: Within Functional Limits for tasks assessed                                          General Comments      Exercises Total Joint Exercises Ankle Circles/Pumps: AROM, Strengthening, Left, Right, 10 reps, Supine Quad Sets: AROM, Left, 5 reps, Supine Straight Leg Raises: Strengthening, Left, 5 reps, Supine, AAROM Other Exercises Other Exercises: Role of PT in acute setting, d/c recs, DME, WB precautions, safe use of DME    Assessment/Plan    PT Assessment Patient needs continued PT services  PT Problem List Decreased strength;Decreased range of motion;Decreased activity tolerance;Decreased safety awareness;Decreased balance;Pain;Decreased mobility       PT Treatment Interventions DME instruction;Balance training;Gait training;Neuromuscular re-education;Stair training;Functional mobility training;Patient/family education;Therapeutic activities;Therapeutic exercise    PT Goals (Current goals can be found in the Care Plan section)  Acute Rehab PT Goals Patient Stated Goal: Return home with daughter's help PT Goal Formulation: With patient Time For Goal Achievement: 08/19/22 Potential to Achieve Goals: Good    Frequency BID     Co-evaluation               AM-PAC PT "6 Clicks" Mobility  Outcome Measure Help needed turning from your back to your side while in a flat bed without using bedrails?: A Little Help needed moving from lying on your back to sitting on the side of a flat bed without using bedrails?: A Little Help needed moving to and from a bed to a chair (including a wheelchair)?: A Little Help needed standing up from a chair using your arms (e.g., wheelchair or bedside chair)?: A Little Help needed to walk in hospital room?: A Little Help needed climbing 3-5 steps with a railing? : A Lot 6 Click Score: 17  End of Session Equipment Utilized During Treatment: Gait belt Activity Tolerance: Patient tolerated treatment well Patient left: in chair;with call bell/phone within reach;with chair alarm set;with family/visitor present Nurse Communication: Mobility status PT Visit Diagnosis: Difficulty in walking, not elsewhere classified (R26.2);Muscle weakness (generalized) (M62.81)    Time: 3507-5732 PT Time Calculation (min) (ACUTE ONLY): 28 min   Charges:   PT Evaluation $PT Eval Low Complexity: 1 Low PT Treatments $Therapeutic Exercise: 8-22 mins      Sinclair Arrazola M. Fairly IV, PT,  DPT Physical Therapist- [facility]  08/05/2022, 3:43 PM

## 2022-08-05 NOTE — Op Note (Signed)
Patient Name: Martha [name]  ZOX:[id number]  Pre-Operative Diagnosis: Left knee Osteoarthritis  Post-Operative Diagnosis: (same)  Procedure: Left Total Knee Arthroplasty  Components/Implants: Femur Persona Size 8 PS Left   Tibia Size E Left Tibia w/ 14x63m stem extension  Poly 176mPS  Patella 3248m8.5mm43mmmetric  Date of Surgery: 08/05/2022  Surgeon: ZachSteffanie Rainwater Assistant: ThomDorise Hiss(present and scrubbed throughout the case, critical for assistance with exposure, retraction, instrumentation, and closure)   Anesthesiologist: HoweLavone Neriesthesia: Spinal  Tourniquet Time: 77 min  EBL: 25cc  IVF: [id number]: None   Brief history: The patient is a 66 y52r old female with a history of osteoarthritis of the left knee with pain limiting their range of motion and activities of daily living, which has failed multiple attempts at conservative therapy.  The risks and benefits of total knee arthroplasty as definitive surgical treatment were discussed with the patient, who opted to proceed with the operation.  After outpatient medical clearance and optimization was completed the patient was admitted to [facility] the procedure.  All preoperative films were reviewed and an appropriate surgical plan was made prior to surgery. Preoperative range of motion was 5 to 90 with a 5 deg flexion contracture. The patient was identified as having a neutral alignment.   Description of procedure: The patient was brought to the operating room where laterality was confirmed by all those present to be the left side.   Spinal anesthesia was administered and the patient received an intravenous dose of antibiotics for surgical prophylaxis and a dose of tranexamic acid.  Patient is positioned supine on the operating room table with all bony prominences well-padded.  A well-padded tourniquet was applied to the left thigh.  The knee was then prepped and  draped in usual sterile fashion with multiple layers of adhesive and nonadhesive drapes.  All of those present in the operating room participated in a surgical timeout laterality and patient were confirmed.   An Esmarch was wrapped around the extremity and the leg was elevated and the knee flexed.  The tourniquet was inflated to a pressure of 275 mmHg. The Esmarch was removed and the leg was brought down to full extension.  The patella and tibial tubercle identified and outlined using a marking pen and a midline skin incision was made with a knife carried through the subcutaneous tissue down to the extensor retinaculum.  After exposure of the extensor mechanism the medial parapatellar arthrotomy was performed with a scalpel and electrocautery extending down medial and distal to the tibial tubercle taking care to avoid incising the patellar tendon.   A standard medial release was performed over the proximal tibia.  The knee was brought into extension in order to excise the fat pad taking care not to damage the patella tendon.  The superior soft tissue was removed from the anterior surface of the distal femur to visualize for the procedure.  The knee was then brought into flexion with the patella subluxed laterally and subluxing the tibia anteriorly.  The ACL and PCL were transected and removed with electrocautery and additional soft tissue was removed from the proximal surface of the tibia to fully expose.   An extramedullary tibial cutting guide was then applied to the leg with a spring-loaded ankle clamp placed around the distal tibia just above the malleoli the angulation of the guide was adjusted to give some posterior slope in the tibial resection with a neutral varus/valgus alignment.  The  resection guide was then pinned to the proximal tibia and the proximal tibial surface was resected with an oscillating saw.  Careful attention was paid to ensure the blade did not disrupt any of the soft tissues  including any lateral or medial ligament.  Attention was then turned to the femur, with the knee slightly flexed a opening drill was used to enter the medullary canal of the femur.  After removing the drill marrow was suctioned out to decompress the distal femur.  An intramedullary femoral guide was then inserted into the drill hole and the alignment guide was seated firmly against the distal end of the medial femoral condyle.  The distal femoral cutting guide was then attached and pinned securely to the anterior surface of the femur and the intramedullary rod and alignment guide was removed.  Distal femur resection was then performed with an oscillating saw with retractors protecting medial and laterally.   The distal cutting block was then removed and the extension gap was checked with a spacer.  Extension gap was found to be appropriately sized to accommodate the spacer block.   The femoral sizing guide was then placed securely into the posterior condyles of the femur and the femoral size was measured and determined to be 8.  The size 8; 4-in-1 cutting guide was placed in position and secured with 2 pins.  The anterior posterior and chamfer resections were then performed with an oscillating saw.  Bony fragments and osteophytes were then removed.  Using a lamina spreader the posterior medial and lateral condyles were checked for additional osteophytes and posterior soft tissue remnants.  Any remaining meniscus was removed at this time.  Periarticular injection was performed in the meniscal rims and posterior capsule with aspiration performed to ensure no intravascular injection.   The tibia was then exposed and the tibial trial was pinned onto the plateau after confirming appropriate orientation and rotation.  Using the drill bushing the tibia was prepared to the appropriate drill depth.  Tibial broach impactor was then driven through the punch guide using a mallet.  The femoral trial component was then  inserted onto the femur. The femoral box was then cut with an oscillating saw.  A trial tibial polyethylene bearing was then placed and the knee was reduced.  The knee achieved full extension with no hyperextension and was found to be balanced in flexion and extension with the trials in place.  The knee was then brought into full extension the patella was everted and held with 2 Kocher clamps.  The articular surface of the patella was then resected with an patella reamer and saw after careful measurement with a caliper.  The patella was then prepared with the drill guide and a trial patella was placed.  The knee was then taken through range of motion and it was found that the patella circulated appropriately with the trochlea and good patellofemoral motion without subluxation.    The correct final components for implantation were confirmed and opened by the circulator nurse.  The prepared surfaces of the patella femur and tibia were cleaned with pulsatile lavage to remove all blood fat and other material and then the surfaces were dried.  2 bags of cement were mixed under vacuum and the components were cemented into place.  Excess cement was removed with curettes and forceps.  The final polyethylene tibial component was implanted and the knee was brought into full extension to allow the cement to set.  At this time the periarticular injection  cocktail was placed in the soft tissues surrounding the knee.  The knee was then irrigated with copious amount of normal saline via pulsatile lavage to remove all loose bodies and other debris.  The knee was then irrigated with Irrisept which was allowed to sit in the knee and reirrigated with saline.  The tourniquet was then dropped and all bleeding vessels were identified and coagulated.  The arthrotomy was approximated with #1 Vicryl and closed with #2 Quill suture.  The knee was brought into slight flexion and the subcutaneous tissues were closed with 0 Vicryl, 2-0  Vicryl and a running subcuticular 3-0 monoderm quil suture.  Skin was then glued with Dermabond.  A sterile adhesive dressing was then placed along with a sequential compression device to the calf, a Ted stocking, and a cryotherapy cuff.   Sponge, needle, and Lap counts were all correct at the end of the case.   The patient was transferred off of the operating room table to a hospital bed, good pulses were found distally on the operative side.  The patient was transferred to the recovery room in stable condition.

## 2022-08-05 NOTE — Anesthesia Preprocedure Evaluation (Addendum)
Anesthesia Evaluation  Patient identified by MRN, date of birth, ID band Patient awake    Reviewed: Allergy & Precautions, NPO status , Patient's Chart, lab work & pertinent test results  History of Anesthesia Complications (+) PONV and history of anesthetic complications  Airway Mallampati: III  TM Distance: >3 FB Neck ROM: full    Dental  (+) Chipped, Upper Dentures, Missing   Pulmonary neg pulmonary ROS   Pulmonary exam normal        Cardiovascular hypertension, + DOE  Normal cardiovascular exam  Sinus arrthm on EKG - no sig change   Neuro/Psych negative neurological ROS  negative psych ROS   GI/Hepatic negative GI ROS, Neg liver ROS,neg GERD  ,,  Endo/Other  diabetes, Type 2  Last GLP last week  Renal/GU      Musculoskeletal  (+) Arthritis ,    Abdominal   Peds  Hematology  (+) Blood dyscrasia, anemia   Anesthesia Other Findings Past Medical History: No date: Anemia No date: Arthritis No date: Complication of anesthesia 2020: COVID-19 No date: DOE (dyspnea on exertion) No date: HLD (hyperlipidemia) No date: HTN (hypertension) No date: PONV (postoperative nausea and vomiting) No date: T2DM (type 2 diabetes mellitus) (HCC) No date: Uterine cancer ([place])  Past Surgical History: No date: BREAST EXCISIONAL BIOPSY; Right     Comment:  neg 1997: REDUCTION MAMMAPLASTY; Bilateral 01/17/2022: TOTAL KNEE ARTHROPLASTY; Right     Comment:  Procedure: TOTAL KNEE ARTHROPLASTY;  Surgeon: Hessie Knows, MD;  Location: [facility] ORS;  Service: Orthopedics;               Laterality: Right; 2002: UTERINE FIBROID SURGERY  BMI    Body Mass Index: 41.44 kg/m      Reproductive/Obstetrics negative OB ROS                             Anesthesia Physical Anesthesia Plan  ASA: 3  Anesthesia Plan: Spinal   Post-op Pain Management: Regional block*   Induction:   PONV Risk Score  and Plan: 3  Airway Management Planned: Natural Airway and Nasal Cannula  Additional Equipment:   Intra-op Plan:   Post-operative Plan:   Informed Consent: I have reviewed the patients History and Physical, chart, labs and discussed the procedure including the risks, benefits and alternatives for the proposed anesthesia with the patient or authorized representative who has indicated his/her understanding and acceptance.     Dental Advisory Given  Plan Discussed with: Anesthesiologist, CRNA and Surgeon  Anesthesia Plan Comments: (Patient reports no bleeding problems and no anticoagulant use.  Plan for spinal with backup GA  Patient consented for risks of anesthesia including but not limited to:  - adverse reactions to medications - damage to eyes, teeth, lips or other oral mucosa - nerve damage due to positioning  - risk of bleeding, infection and or nerve damage from spinal that could lead to paralysis - risk of headache or failed spinal - damage to teeth, lips or other oral mucosa - sore throat or hoarseness - damage to heart, brain, nerves, lungs, other parts of body or loss of life  Patient voiced understanding.)        Anesthesia Quick Evaluation

## 2022-08-05 NOTE — Anesthesia Procedure Notes (Signed)
Spinal  Patient location during procedure: OR Start time: 08/05/2022 7:38 AM End time: 08/05/2022 7:43 AM Reason for block: surgical anesthesia Staffing Performed: resident/CRNA  Resident/CRNA: Jacqualin Combes, CRNA Performed by: Jacqualin Combes, CRNA Authorized by: Alphonsus Sias, MD   Preanesthetic Checklist Completed: patient identified, IV checked, site marked, risks and benefits discussed, surgical consent, monitors and equipment checked, pre-op evaluation and timeout performed Spinal Block Patient position: sitting Prep: DuraPrep Patient monitoring: heart rate, cardiac monitor, continuous pulse ox and blood pressure Approach: midline Location: L3-4 Injection technique: single-shot Needle Needle type: Sprotte  Needle gauge: 24 G Needle length: 9 cm Assessment Sensory level: T4 Events: CSF return Additional Notes Spinal placed x1 pass; return of clear, free flowing CSF. CA

## 2022-08-06 ENCOUNTER — Other Ambulatory Visit (HOSPITAL_COMMUNITY): Payer: Self-pay

## 2022-08-06 ENCOUNTER — Encounter: Payer: Self-pay | Admitting: Orthopedic Surgery

## 2022-08-06 DIAGNOSIS — M1712 Unilateral primary osteoarthritis, left knee: Secondary | ICD-10-CM | POA: Diagnosis not present

## 2022-08-06 LAB — CBC
HCT: 35.8 % — ABNORMAL LOW (ref 36.0–46.0)
Hemoglobin: 11.8 g/dL — ABNORMAL LOW (ref 12.0–15.0)
MCH: 28.5 pg (ref 26.0–34.0)
MCHC: 33 g/dL (ref 30.0–36.0)
MCV: 86.5 fL (ref 80.0–100.0)
Platelets: 311 10*3/uL (ref 150–400)
RBC: 4.14 MIL/uL (ref 3.87–5.11)
RDW: 13.4 % (ref 11.5–15.5)
WBC: 19 10*3/uL — ABNORMAL HIGH (ref 4.0–10.5)
nRBC: 0 % (ref 0.0–0.2)

## 2022-08-06 LAB — BASIC METABOLIC PANEL
Anion gap: 7 (ref 5–15)
BUN: 26 mg/dL — ABNORMAL HIGH (ref 8–23)
CO2: 21 mmol/L — ABNORMAL LOW (ref 22–32)
Calcium: 9.1 mg/dL (ref 8.9–10.3)
Chloride: 103 mmol/L (ref 98–111)
Creatinine, Ser: 0.7 mg/dL (ref 0.44–1.00)
GFR, Estimated: >60 mL/min (ref >60)
Glucose, Bld: 277 mg/dL — ABNORMAL HIGH (ref 70–99)
Potassium: 4.6 mmol/L (ref 3.5–5.1)
Sodium: 131 mmol/L — ABNORMAL LOW (ref 135–145)

## 2022-08-06 LAB — GLUCOSE, CAPILLARY
Glucose-Capillary: 228 mg/dL — ABNORMAL HIGH (ref 70–99)
Glucose-Capillary: 286 mg/dL — ABNORMAL HIGH (ref 70–99)

## 2022-08-06 MED ORDER — TRAMADOL HCL 50 MG PO TABS: 50.0000 mg | ORAL_TABLET | Freq: Four times a day (QID) | ORAL | 0 refills | Status: DC | PRN

## 2022-08-06 MED ORDER — DOCUSATE SODIUM 100 MG PO CAPS: 100.0000 mg | ORAL_CAPSULE | Freq: Two times a day (BID) | ORAL | 0 refills | Status: AC

## 2022-08-06 MED ORDER — ACETAMINOPHEN 500 MG PO TABS: 1000.0000 mg | ORAL_TABLET | Freq: Three times a day (TID) | ORAL | 0 refills | Status: AC

## 2022-08-06 MED ORDER — ENOXAPARIN SODIUM 40 MG/0.4ML IJ SOSY: 40.0000 mg | PREFILLED_SYRINGE | INTRAMUSCULAR | 0 refills | Status: DC

## 2022-08-06 MED ORDER — CELECOXIB 200 MG PO CAPS: 200.0000 mg | ORAL_CAPSULE | Freq: Two times a day (BID) | ORAL | 0 refills | Status: AC

## 2022-08-06 NOTE — Plan of Care (Signed)
  Problem: Coping: Goal: Ability to adjust to condition or change in health will improve Outcome: Progressing   Problem: Fluid Volume: Goal: Ability to maintain a balanced intake and output will improve Outcome: Progressing   Problem: Skin Integrity: Goal: Risk for impaired skin integrity will decrease Outcome: Progressing   Problem: Tissue Perfusion: Goal: Adequacy of tissue perfusion will improve Outcome: Progressing   Problem: Activity: Goal: Ability to avoid complications of mobility impairment will improve Outcome: Progressing

## 2022-08-06 NOTE — Plan of Care (Signed)
  Problem: Coping: Goal: Ability to adjust to condition or change in health will improve Outcome: Progressing   Problem: Nutritional: Goal: Maintenance of adequate nutrition will improve Outcome: Progressing   Problem: Skin Integrity: Goal: Risk for impaired skin integrity will decrease Outcome: Progressing   Problem: Activity: Goal: Ability to avoid complications of mobility impairment will improve Outcome: Progressing Goal: Range of joint motion will improve Outcome: Progressing

## 2022-08-06 NOTE — Discharge Instructions (Signed)
 Instructions after Total Knee Replacement   Zachary Aberman M.D.     Dept. of Orthopaedics & Sports Medicine  [facility]  [address]  [place], [place]  [address]  Phone: [phone]   Fax: [phone]    DIET: Drink plenty of non-alcoholic fluids. Resume your normal diet. Include foods high in fiber.  ACTIVITY:  You may use crutches or a walker with weight-bearing as tolerated, unless instructed otherwise. You may be weaned off of the walker or crutches by your Physical Therapist.  Do NOT place pillows under the knee. Anything placed under the knee could limit your ability to straighten the knee.   Continue doing gentle exercises. Exercising will reduce the pain and swelling, increase motion, and prevent muscle weakness.   Please continue to use the TED compression stockings for 2 weeks. You may remove the stockings at night, but should reapply them in the morning. Do not drive or operate any equipment until instructed.  WOUND CARE:  Continue to use the PolarCare or ice packs periodically to reduce pain and swelling. You may begin showering 3 days after surgery with honeycomb dressing. Remove honeycomb dressing 7 days after surgery and continue showering. Allow dermabond to fall off on its own.  MEDICATIONS: You may resume your regular medications. Please take the pain medication as prescribed on the medication. Do not take pain medication on an empty stomach. You have been given a prescription for a blood thinner (Lovenox or Coumadin). Please take the medication as instructed. (NOTE: After completing a 2 week course of Lovenox, take one 81 mg Enteric-coated aspirin twice a day. This along with elevation will help reduce the possibility of phlebitis in your operated leg.) Do not drive or drink alcoholic beverages when taking pain medications.  POSTOPERATIVE CONSTIPATION PROTOCOL Constipation - defined medically as fewer than three stools per week and severe  constipation as less than one stool per week.  One of the most common issues patients have following surgery is constipation.  Even if you have a regular bowel pattern at home, your normal regimen is likely to be disrupted due to multiple reasons following surgery.  Combination of anesthesia, postoperative narcotics, change in appetite and fluid intake all can affect your bowels.  In order to avoid complications following surgery, here are some recommendations in order to help you during your recovery period.  Colace (docusate) - Pick up an over-the-counter form of Colace or another stool softener and take twice a day as long as you are requiring postoperative pain medications.  Take with a full glass of water daily.  If you experience loose stools or diarrhea, hold the colace until you stool forms back up.  If your symptoms do not get better within 1 week or if they get worse, check with your doctor.  Dulcolax (bisacodyl) - Pick up over-the-counter and take as directed by the product packaging as needed to assist with the movement of your bowels.  Take with a full glass of water.  Use this product as needed if not relieved by Colace only.   MiraLax (polyethylene glycol) - Pick up over-the-counter to have on hand.  MiraLax is a solution that will increase the amount of water in your bowels to assist with bowel movements.  Take as directed and can mix with a glass of water, juice, soda, coffee, or tea.  Take if you go more than two days without a movement. Do not use MiraLax more than once per day. Call your doctor if   you are still constipated or irregular after using this medication for 7 days in a row.  If you continue to have problems with postoperative constipation, please contact the office for further assistance and recommendations.  If you experience "the worst abdominal pain ever" or develop nausea or vomiting, please contact the office immediatly for further recommendations for  treatment.   CALL THE OFFICE FOR: Temperature above 101 degrees Excessive bleeding or drainage on the dressing. Excessive swelling, coldness, or paleness of the toes. Persistent nausea and vomiting.  FOLLOW-UP:  You should have an appointment to return to the office in 14 days after surgery. Arrangements have been made for continuation of Physical Therapy (either home therapy or outpatient therapy).  

## 2022-08-06 NOTE — Anesthesia Postprocedure Evaluation (Signed)
Anesthesia Post Note  Patient: Martha [name]  Procedure(s) Performed: TOTAL KNEE ARTHROPLASTY (Left: Knee)  Patient location during evaluation: PACU Anesthesia Type: Spinal Level of consciousness: awake and alert Pain management: pain level controlled Vital Signs Assessment: post-procedure vital signs reviewed and stable Respiratory status: spontaneous breathing, nonlabored ventilation and respiratory function stable Cardiovascular status: blood pressure returned to baseline and stable Postop Assessment: no apparent nausea or vomiting Anesthetic complications: no   No notable events documented.   Last Vitals:  Vitals:   08/05/22 1632 08/05/22 2311  BP: (!) 149/94 116/84  Pulse: 95 78  Resp: 16 14  Temp: (!) 36.4 C (!) 36.4 C  SpO2: 97% 97%    Last Pain:  Vitals:   08/05/22 1933  TempSrc:   PainSc: 2                  Alphonsus Sias

## 2022-08-06 NOTE — Progress Notes (Signed)
Physical Therapy Treatment Patient Details Name: Martha [name] MRN: [id number] DOB: [birth year] Today's Date: 08/06/2022   History of Present Illness Pt is a 66 y.o. female s/p L TKA 08/05/22. WBAT.    PT Comments    Pt received upright in bed agreeable to PT. Daughter present to assist in interpretation. Pt mod-I with bed mobility standing with supervision.  VC's required for hand placement. Pt ambulating ~300' during session progressing to step through pattern and consistent LLE heel strike during initial contact demonstrating excellent quad control. Pt able to asc/desc 4 steps twice with B hand rails demonstrating safe completion for home set up with min VC's for correct LE sequencing. Pt returning to room performing toileting tasks, hand hygiene and pericare independently returning to supine. Education provided on HEP as described below with education on reps/sets/frequency with education on safe car transfer. Pt and daughter understanding and remain agreeable to d/c home with HHPT services. Pt completing all necessary PT goals. Medical team updated.    Recommendations for follow up therapy are one component of a multi-disciplinary discharge planning process, led by the attending physician.  Recommendations may be updated based on patient status, additional functional criteria and insurance authorization.  Follow Up Recommendations  Home health PT     Assistance Recommended at Discharge Intermittent Supervision/Assistance  Patient can return home with the following A little help with walking and/or transfers;Assist for transportation;A little help with bathing/dressing/bathroom;Help with stairs or ramp for entrance   Equipment Recommendations  BSC/3in1    Recommendations for Other Services       Precautions / Restrictions Precautions Precautions: Knee;Fall Precaution Booklet Issued: Yes (comment) Restrictions Weight Bearing Restrictions: Yes LLE Weight Bearing: Weight  bearing as tolerated     Mobility  Bed Mobility Overal bed mobility: Modified Independent               Patient Response: Cooperative  Transfers Overall transfer level: Needs assistance Equipment used: Rolling walker (2 wheels) Transfers: Sit to/from Stand Sit to Stand: Supervision           General transfer comment: VC's for hand placement    Ambulation/Gait Ambulation/Gait assistance: Supervision Gait Distance (Feet): 300 Feet Assistive device: Rolling walker (2 wheels) Gait Pattern/deviations: Step-through pattern, Decreased step length - right, Decreased step length - left, Decreased stance time - left       General Gait Details: Improved RW use with step through pattern with consistent LLE heel strike.   Stairs Stairs: Yes Stairs assistance: Min guard Stair Management: Two rails, Step to pattern, Forwards Number of Stairs: 4 General stair comments: asc/desc x2 with correct sequencing with VC's. Good stability and safe performance.   Wheelchair Mobility    Modified Rankin (Stroke Patients Only)       Balance Overall balance assessment: Needs assistance Sitting-balance support: Bilateral upper extremity supported, Feet supported Sitting balance-Leahy Scale: Good       Standing balance-Leahy Scale: Fair Standing balance comment: can stand at bedside without UE support                            Cognition Arousal/Alertness: Awake/alert Behavior During Therapy: WFL for tasks assessed/performed Overall Cognitive Status: Within Functional Limits for tasks assessed                                          Exercises  Total Joint Exercises Ankle Circles/Pumps: AROM, Strengthening, Left, Right, 10 reps, Supine Quad Sets: AROM, Left, Supine, 10 reps Gluteal Sets: AROM, Strengthening, Both, 10 reps, Supine Short Arc Quad: AROM, Strengthening, Left, 10 reps, Supine Heel Slides: AAROM, Strengthening, Left, 10 reps,  Supine Hip ABduction/ADduction: AROM, Strengthening, Left, 10 reps, Supine Straight Leg Raises: Strengthening, Left, Supine, AROM, 10 reps Long Arc Quad: AROM, Strengthening, Left, 10 reps, Supine Other Exercises Other Exercises: education on car transfer.    General Comments        Pertinent Vitals/Pain Pain Assessment Pain Assessment: Faces Faces Pain Scale: Hurts little more Pain Location: L knee Pain Descriptors / Indicators: Aching, Discomfort Pain Intervention(s): Limited activity within patient's tolerance, Monitored during session, Premedicated before session, Repositioned, Ice applied    Home Living                          Prior Function            PT Goals (current goals can now be found in the care plan section) Acute Rehab PT Goals Patient Stated Goal: Return home with daughter's help PT Goal Formulation: With patient Time For Goal Achievement: 08/19/22 Potential to Achieve Goals: Good Progress towards PT goals: Progressing toward goals    Frequency    BID      PT Plan Current plan remains appropriate    Co-evaluation              AM-PAC PT "6 Clicks" Mobility   Outcome Measure  Help needed turning from your back to your side while in a flat bed without using bedrails?: A Little Help needed moving from lying on your back to sitting on the side of a flat bed without using bedrails?: A Little Help needed moving to and from a bed to a chair (including a wheelchair)?: A Little Help needed standing up from a chair using your arms (e.g., wheelchair or bedside chair)?: A Little Help needed to walk in hospital room?: A Little Help needed climbing 3-5 steps with a railing? : A Little 6 Click Score: 18    End of Session Equipment Utilized During Treatment: Gait belt Activity Tolerance: Patient tolerated treatment well Patient left: in bed;with call bell/phone within reach;with bed alarm set;with family/visitor present Nurse  Communication: Mobility status PT Visit Diagnosis: Difficulty in walking, not elsewhere classified (R26.2);Muscle weakness (generalized) (M62.81)     Time: 1008-1040 PT Time Calculation (min) (ACUTE ONLY): 32 min  Charges:  $Gait Training: 8-22 mins $Therapeutic Exercise: 8-22 mins                     Miciah Shealy M. Fairly IV, PT, DPT Physical Therapist- [facility]  08/06/2022, 11:22 AM

## 2022-08-06 NOTE — Discharge Summary (Signed)
Physician Discharge Summary  Patient ID: Martha [name] MRN: [id number] DOB/AGE: 65-Jul-1957 65 y.o.  Admit date: 08/05/2022 Discharge date: 08/06/2022  Admission Diagnoses:  Localized osteoarthritis of left knee [M17.12]   Discharge Diagnoses: Patient Active Problem List   Diagnosis Date Noted   Localized osteoarthritis of left knee 08/05/2022   S/P TKR (total knee replacement) using cement, right 01/17/2022    Past Medical History:  Diagnosis Date   Anemia    Arthritis    Complication of anesthesia    COVID-19 2020   DOE (dyspnea on exertion)    HLD (hyperlipidemia)    HTN (hypertension)    PONV (postoperative nausea and vomiting)    T2DM (type 2 diabetes mellitus) ([facility])    Uterine cancer ([place])      Transfusion: none   Consultants (if any):   Discharged Condition: Improved  Hospital Course: Martha [name] is an 66 y.o. female who was admitted 08/05/2022 with a diagnosis of Localized osteoarthritis of left knee and went to the operating room on 08/05/2022 and underwent the above named procedures.    Surgeries: Procedure(s): TOTAL KNEE ARTHROPLASTY on 08/05/2022 Patient tolerated the surgery well. Taken to PACU where she was stabilized and then transferred to the orthopedic floor.  Started on Lovenox 30 mg q 12 hrs. TEDs and SCDs applied bilaterally. Heels elevated on bed. No evidence of DVT. Negative Homan. Physical therapy started on day #1 for gait training and transfer. OT started day #1 for ADL and assisted devices.  Patient's IV was d/c on day #1. Patient was able to safely and independently complete all PT goals. PT recommending discharge to home.    On post op day #1 patient was stable and ready for discharge to home with HHPT.  Implants: : Femur Persona Size 8 PS Left   Tibia Size E Left Tibia w/ 14x48m stem extension  Poly 127mPS  Patella 3288m8.5mm103mmmetric   She was given perioperative antibiotics:  Anti-infectives (From admission,  onward)    Start     Dose/Rate Route Frequency Ordered Stop   08/05/22 1530  ceFAZolin (ANCEF) IVPB 2g/100 mL premix        2 g 200 mL/hr over 30 Minutes Intravenous Every 6 hours 08/05/22 1438 08/06/22 0700   08/05/22 0631  ceFAZolin (ANCEF) 2-4 GM/100ML-% IVPB       Note to Pharmacy: MileJeanene Erbcabinet override      08/05/22 0631 08/05/22 0755   08/05/22 0630  ceFAZolin (ANCEF) IVPB 2g/100 mL premix        2 g 200 mL/hr over 30 Minutes Intravenous On call to O.R. 08/05/22 [id number] 0745     .  She was given sequential compression devices, early ambulation, and Lovenox TEDs for DVT prophylaxis.  She benefited maximally from the hospital stay and there were no complications.    Recent vital signs:  Vitals:   08/05/22 2311 08/06/22 0752  BP: 116/84 135/77  Pulse: 78 62  Resp: 14   Temp: (!) 97.5 F (36.4 C) 97.8 F (36.6 C)  SpO2: 97% 96%    Recent laboratory studies:  Lab Results  Component Value Date   HGB 11.8 (L) 08/06/2022   HGB 15.1 (H) 07/25/2022   HGB 10.8 (L) 01/19/2022   Lab Results  Component Value Date   WBC 19.0 (H) 08/06/2022   PLT 311 08/06/2022   No results found for: "INR" Lab Results  Component Value Date   NA 131 (L) 08/06/2022   K  4.6 08/06/2022   CL 103 08/06/2022   CO2 21 (L) 08/06/2022   BUN 26 (H) 08/06/2022   CREATININE 0.70 08/06/2022   GLUCOSE 277 (H) 08/06/2022    Discharge Medications:   Allergies as of 08/06/2022       Reactions   Atorvastatin Shortness Of Breath   Pravastatin Shortness Of Breath   Sulfamethoxazole-trimethoprim Other (See Comments)   Cold sweats, could not walk Other reaction(s): Dizziness Hot and Cold Sweats and Tongue Numbness    Hydrochlorothiazide    Metoprolol         Medication List     STOP taking these medications    naproxen 500 MG tablet Commonly known as: NAPROSYN       TAKE these medications    acetaminophen 500 MG tablet Commonly known as: TYLENOL Take 2  tablets (1,000 mg total) by mouth every 8 (eight) hours.   celecoxib 200 MG capsule Commonly known as: CeleBREX Take 1 capsule (200 mg total) by mouth 2 (two) times daily for 10 days.   docusate sodium 100 MG capsule Commonly known as: COLACE Take 1 capsule (100 mg total) by mouth 2 (two) times daily.   empagliflozin 25 MG Tabs tablet Commonly known as: JARDIANCE Take 25 mg by mouth daily after lunch.   enoxaparin 40 MG/0.4ML injection Commonly known as: LOVENOX Inject 0.4 mLs (40 mg total) into the skin daily for 14 days.   gabapentin 800 MG tablet Commonly known as: NEURONTIN Take 800 mg by mouth at bedtime.   losartan 100 MG tablet Commonly known as: COZAAR Take 100 mg by mouth at bedtime.   spironolactone 50 MG tablet Commonly known as: ALDACTONE Take 50 mg by mouth daily.   traMADol 50 MG tablet Commonly known as: ULTRAM Take 1 tablet (50 mg total) by mouth every 6 (six) hours as needed for moderate pain.   Trulicity 3 NI/7.7OE Sopn Generic drug: Dulaglutide Inject 3 mg into the skin once a week. Sunday        Diagnostic Studies: DG Knee 1-2 Views Left  Result Date: 08/05/2022 CLINICAL DATA:  Status post left total knee replacement. EXAM: LEFT KNEE - 1-2 VIEW COMPARISON:  None Available. FINDINGS: Sequelae of total knee arthroplasty are identified. The prosthetic components appear normally aligned. No acute fracture is identified. Gas is noted in the knee joint. IMPRESSION: Left total knee arthroplasty without evidence of acute complication. Electronically Signed   By: Logan Bores M.D.   On: 08/05/2022 11:57    Disposition: Discharge disposition: 06-Home-Health Care Svc          Follow-up Information     Duanne Guess, PA-C Follow up in 2 week(s).   Specialties: Orthopedic Surgery, Emergency Medicine Contact information: [place] [address] [phone]                  Signed: Feliberto Gottron 08/06/2022, 12:11 PM

## 2022-08-06 NOTE — Progress Notes (Signed)
Discharge instructions reviewed with patient utilizing in house Spanish interpreter Herb Grays) including followup visits and new medications.  Understanding was verbalized and all questions were answered.  IV removed without complication; patient tolerated well.

## 2022-08-06 NOTE — Progress Notes (Addendum)
   Subjective: 1 Day Post-Op Procedure(s) (LRB): TOTAL KNEE ARTHROPLASTY (Left) Patient reports pain as mild.   Patient is well, and has had no acute complaints or problems Denies any CP, SOB, ABD pain. We will continue therapy today.  Plan is to go Home after hospital stay.  Objective: Vital signs in last 24 hours: Temp:  [97 F (36.1 C)-98 F (36.7 C)] 97.8 F (36.6 C) (12/19 0752) Pulse Rate:  [62-95] 62 (12/19 0752) Resp:  [14-30] 14 (12/18 2311) BP: (100-155)/(69-99) 135/77 (12/19 0752) SpO2:  [92 %-98 %] 96 % (12/19 0752)  Intake/Output from previous day: 12/18 0701 - 12/19 0700 In: 1855.1 [P.O.:240; I.V.:1315.1; IV Piggyback:300] Out: 225 [Urine:200; Blood:25] Intake/Output this shift: No intake/output data recorded.  Recent Labs    08/06/22 0329  HGB 11.8*   Recent Labs    08/06/22 0329  WBC 19.0*  RBC 4.14  HCT 35.8*  PLT 311   Recent Labs    08/06/22 0329  NA 131*  K 4.6  CL 103  CO2 21*  BUN 26*  CREATININE 0.70  GLUCOSE 277*  CALCIUM 9.1   No results for input(s): "LABPT", "INR" in the last 72 hours.  EXAM General - Patient is Alert, Appropriate, and Oriented Extremity - Sensation intact distally Intact pulses distally Dorsiflexion/Plantar flexion intact No cellulitis present Compartment soft Dressing - dressing C/D/I and no drainage Motor Function - intact, moving foot and toes well on exam.   Past Medical History:  Diagnosis Date   Anemia    Arthritis    Complication of anesthesia    COVID-19 2020   DOE (dyspnea on exertion)    HLD (hyperlipidemia)    HTN (hypertension)    PONV (postoperative nausea and vomiting)    T2DM (type 2 diabetes mellitus) ([facility])    Uterine cancer ([facility])     Assessment/Plan:   1 Day Post-Op Procedure(s) (LRB): TOTAL KNEE ARTHROPLASTY (Left) Principal Problem:   Localized osteoarthritis of left knee  Estimated body mass index is 41.44 kg/m as calculated from the following:   Height as of this  encounter: '5\' 4"'$  (1.626 m).   Weight as of this encounter: 109.5 kg. Advance diet Up with therapy Labs and VSS Pain well controlled CM to assist with discharge to home with HHPT today pending completion of PT goals   DVT Prophylaxis - Lovenox, TED hose, and SCDs Weight-Bearing as tolerated to left leg   T. Rachelle Hora, PA-C [place] 08/06/2022, 8:07 AM   Patient seen and examined, agree with above plan.  The patient is doing well status post left total knee arthroplasty, no concerns at this time.  Pain is controlled.  Discussed DVT prophylaxis, pain medication use, and safe transition to home.  All questions answered the patient agrees with above plan will go  home after clears PT.   Steffanie Rainwater MD

## 2022-08-07 NOTE — TOC Transition Note (Signed)
Transition of Care [name]) - CM/SW Discharge Note   Patient Details  Name: Martha [name] MRN: [id number] Date of Birth: 09/17/1955  [facility]) CM/SW Contact:  Quin Hoop, LCSW Phone Number: 08/07/2022, 8:27 AM   Clinical Narrative:     Ptt discharged homo with home health being provided by [facility] (set up prior to admission by [facility]).   Final next level of care: [place] Barriers to Discharge: No Barriers Identified   Patient Goals and CMS Choice Patient states their goals for this hospitalization and ongoing recovery are:: to go home      Discharge Placement                       Discharge Plan and Services                          HH Arranged: PT, OT [facility] Agency: [place] Date [place]: 08/06/22   Representative spoke with at [place]: Set up by [place] Determinants of Health (SDOH) Interventions     Readmission Risk Interventions     No data to display

## 2024-06-08 ENCOUNTER — Other Ambulatory Visit: Payer: Self-pay

## 2024-06-08 ENCOUNTER — Encounter: Payer: Self-pay | Admitting: Emergency Medicine

## 2024-06-08 ENCOUNTER — Emergency Department
Admission: EM | Admit: 2024-06-08 | Discharge: 2024-06-08 | Disposition: A | Discharge status: Home / Self Care | Attending: Emergency Medicine | Admitting: Emergency Medicine

## 2024-06-08 DIAGNOSIS — I10 Essential (primary) hypertension: Secondary | ICD-10-CM | POA: Insufficient documentation

## 2024-06-08 DIAGNOSIS — T7840XA Allergy, unspecified, initial encounter: Secondary | ICD-10-CM

## 2024-06-08 DIAGNOSIS — E119 Type 2 diabetes mellitus without complications: Secondary | ICD-10-CM | POA: Diagnosis not present

## 2024-06-08 DIAGNOSIS — L500 Allergic urticaria: Secondary | ICD-10-CM | POA: Insufficient documentation

## 2024-06-08 MED ORDER — METHYLPREDNISOLONE SODIUM SUCC 125 MG IJ SOLR
125.0000 mg | Freq: Once | INTRAMUSCULAR | Status: AC
  Administered 2024-06-08: 125 mg via INTRAVENOUS
  Filled 2024-06-08: qty 2

## 2024-06-08 MED ORDER — DIPHENHYDRAMINE HCL 50 MG/ML IJ SOLN
25.0000 mg | Freq: Once | INTRAMUSCULAR | Status: AC
  Administered 2024-06-08: 25 mg via INTRAVENOUS
  Filled 2024-06-08: qty 1

## 2024-06-08 MED ORDER — FAMOTIDINE IN NACL 20-0.9 MG/50ML-% IV SOLN
20.0000 mg | Freq: Once | INTRAVENOUS | Status: AC
  Administered 2024-06-08: 20 mg via INTRAVENOUS
  Filled 2024-06-08: qty 50

## 2024-06-08 MED ORDER — HYDROXYZINE HCL 10 MG PO TABS: 10.0000 mg | ORAL_TABLET | Freq: Three times a day (TID) | ORAL | 0 refills | Status: DC | PRN

## 2024-06-08 MED ORDER — FAMOTIDINE 20 MG PO TABS: 20.0000 mg | ORAL_TABLET | Freq: Two times a day (BID) | ORAL | 1 refills | Status: AC

## 2024-06-08 MED ORDER — SODIUM CHLORIDE 0.9 % IV BOLUS
1000.0000 mL | Freq: Once | INTRAVENOUS | Status: AC
  Administered 2024-06-08: 1000 mL via INTRAVENOUS

## 2024-06-08 MED ORDER — PREDNISONE 10 MG (21) PO TBPK: ORAL_TABLET | ORAL | 0 refills | Status: AC

## 2024-06-08 NOTE — Discharge Instructions (Signed)
 You are allergic to clindamycin, please let your doctor know I have added that medication to your allergy list here at [place] Take the steroid pack starting tomorrow Take another dose of pepcid  later today, benadryl  later today, and hydroxyzine for itching as needed

## 2024-06-08 NOTE — ED Provider Notes (Signed)
 [facility] Provider Note    Event Date/Time   First MD Initiated Contact with Patient 06/08/24 1204     (approximate)   History   Allergic Reaction   HPI  Ahlia Lemanski is a 68 y.o. female history of diabetes, hypertension, hyperlipidemia, anemia, presents emergency department complaining of hives and itching all over since starting clindamycin.  Patient started last week.  There is a small spot on her left lower leg that her doctor was trying to treat with antibiotics.  States she is not having any difficulty breathing or swallowing and is just itching all over.  In her scalp, chest back arms legs.      Physical Exam   Triage Vital Signs: ED Triage Vitals  Encounter Vitals Group     BP 06/08/24 1130 (!) 140/84     Girls Systolic BP Percentile --      Girls Diastolic BP Percentile --      Boys Systolic BP Percentile --      Boys Diastolic BP Percentile --      Pulse Rate 06/08/24 1130 89     Resp 06/08/24 1130 18     Temp 06/08/24 1130 97.8 F (36.6 C)     Temp Source 06/08/24 1130 Oral     SpO2 06/08/24 1130 100 %     Weight 06/08/24 1129 237 lb (107.5 kg)     Height 06/08/24 1129 5' 4 (1.626 m)     Head Circumference --      Peak Flow --      Pain Score 06/08/24 1139 10     Pain Loc --      Pain Education --      Exclude from Growth Chart --     Most recent vital signs: Vitals:   06/08/24 1130  BP: (!) 140/84  Pulse: 89  Resp: 18  Temp: 97.8 F (36.6 C)  SpO2: 100%     General: Awake, no distress.   CV:  Good peripheral perfusion. regular rate and  rhythm Resp:  Normal effort. Lungs cta Abd:  No distention.   Other:  Skin with rash noted across her entire body, some is just hives and redness and some more red spots, no oral swelling noted, no wheezing or when listening to her lungs   ED Results / Procedures / Treatments   Labs (all labs ordered are listed, but only abnormal results are displayed) Labs Reviewed  - No data to display   EKG     RADIOLOGY     PROCEDURES:   Procedures  Critical Care: None Chief Complaint  Patient presents with   Allergic Reaction      MEDICATIONS ORDERED IN ED: Medications  sodium chloride  0.9 % bolus 1,000 mL (0 mLs Intravenous Stopped 06/08/24 1419)  methylPREDNISolone sodium succinate (SOLU-MEDROL) 125 mg/2 mL injection 125 mg (125 mg Intravenous Given 06/08/24 1246)  famotidine  (PEPCID ) IVPB 20 mg premix (0 mg Intravenous Stopped 06/08/24 1419)  diphenhydrAMINE  (BENADRYL ) injection 25 mg (25 mg Intravenous Given 06/08/24 1246)     IMPRESSION / MDM / ASSESSMENT AND PLAN / ED COURSE  I reviewed the triage vital signs and the nursing notes.                              Differential diagnosis includes, but is not limited to, anaphylaxis, drug reaction, allergic reaction, rash  Patient's presentation is most consistent with acute illness /  injury with system symptoms.   Medications given: Solu-Medrol 125 mg IV, Benadryl  25 mg IV, Pepcid  20 mg IV, normal saline 1 L IV  Patient had relief with the medications.  States she is not itching anymore.  Does still have some redness and areas of hives.  Still no difficulty breathing.  Feel that she is stable for discharge.  Do not feel that she would benefit from an admission at this time.  She was given a prescription for Sterapred, Pepcid , and Atarax if needed for itching.  She is to start steroid tomorrow.  Take 1 Pepcid  later today.  Family is in agreement treatment plan.  Strict instructions to return if worsening.  She is to stop taking the clindamycin.  Do not feel that she needs any further antibiotic as the wound appears to be healing.  Discharged in stable condition.      FINAL CLINICAL IMPRESSION(S) / ED DIAGNOSES   Final diagnoses:  Allergic reaction, initial encounter     Rx / DC Orders   ED Discharge Orders          Ordered    predniSONE (STERAPRED UNI-PAK 21 TAB) 10 MG (21) TBPK  tablet        06/08/24 1335    famotidine  (PEPCID ) 20 MG tablet  2 times daily        06/08/24 1335    hydrOXYzine (ATARAX) 10 MG tablet  3 times daily PRN        06/08/24 1335             Note:  This document was prepared using Dragon voice recognition software and may include unintentional dictation errors.    Gasper Devere ORN, PA-C 06/08/24 ARDELLA Willo Dunnings, MD 06/09/24 [phone]

## 2024-06-08 NOTE — ED Triage Notes (Signed)
 Patient to ED via POV for allergic reaction. Hives all over. States it started yesterday and took benadryl  with no relief. Currently taking clindamycin for spot on her leg- started last Tuesday. Denies difficulty breathing or swallowing. C/o itching.

## 2024-06-10 ENCOUNTER — Other Ambulatory Visit: Payer: Self-pay

## 2024-06-10 ENCOUNTER — Emergency Department
Admission: EM | Admit: 2024-06-10 | Discharge: 2024-06-10 | Disposition: A | Discharge status: Home / Self Care | Attending: Emergency Medicine | Admitting: Emergency Medicine

## 2024-06-10 DIAGNOSIS — T7840XD Allergy, unspecified, subsequent encounter: Secondary | ICD-10-CM | POA: Insufficient documentation

## 2024-06-10 DIAGNOSIS — R21 Rash and other nonspecific skin eruption: Secondary | ICD-10-CM | POA: Diagnosis present

## 2024-06-10 MED ORDER — CYPROHEPTADINE HCL 4 MG PO TABS
4.0000 mg | ORAL_TABLET | Freq: Once | ORAL | Status: AC
  Administered 2024-06-10: 4 mg via ORAL
  Filled 2024-06-10: qty 1

## 2024-06-10 MED ORDER — CYPROHEPTADINE HCL 4 MG PO TABS: 4.0000 mg | ORAL_TABLET | Freq: Three times a day (TID) | ORAL | 0 refills | Status: AC | PRN

## 2024-06-10 NOTE — ED Notes (Signed)
 See triage note  Presents with cont'd rash   Was seen couple of days ago for same    Conts to have generalized rash  But areas are not as red

## 2024-06-10 NOTE — ED Provider Notes (Signed)
 [facility] Emergency Department Provider Note     Event Date/Time   First MD Initiated Contact with Patient 06/10/24 1625     (approximate)   History   Allergic Reaction   HPI  Martha [name] is a 68 y.o. female returns to the ED for ongoing evaluation and management of her drug reaction to clindamycin.  Patient was seen here in the ED 2 days prior, and was told to maybe stop the clindamycin which she has done.  She was prescribed steroids as well as famotidine  and hydroxyzine for her reaction.  By her report, she is taking a steroid as directed.  She took the first dose of hydroxyzine today.  By report she has noticed significant benefit of the itch relief.  Patient denied any difficulty breathing or swallowing, or controlling oral secretions.   The patient's nurse who was present 2 days ago when she initially presented, reports that the patient's rash appears to be improved.   Physical Exam   Triage Vital Signs: ED Triage Vitals  Encounter Vitals Group     BP 06/10/24 1346 (!) 173/86     Girls Systolic BP Percentile --      Girls Diastolic BP Percentile --      Boys Systolic BP Percentile --      Boys Diastolic BP Percentile --      Pulse Rate 06/10/24 1346 72     Resp 06/10/24 1346 17     Temp 06/10/24 1346 97.9 F (36.6 C)     Temp Source 06/10/24 1346 Axillary     SpO2 06/10/24 1346 97 %     Weight 06/10/24 1347 235 lb 14.3 oz (107 kg)     Height 06/10/24 1347 5' 4 (1.626 m)     Head Circumference --      Peak Flow --      Pain Score 06/10/24 1347 0     Pain Loc --      Pain Education --      Exclude from Growth Chart --     Most recent vital signs: Vitals:   06/10/24 1346 06/10/24 1811  BP: (!) 173/86 (!) 150/80  Pulse: 72 70  Resp: 17 16  Temp: 97.9 F (36.6 C) 98 F (36.7 C)  SpO2: 97% 98%    General Awake, no distress. NAD HEENT NCAT. PERRL. EOMI. No rhinorrhea. Mucous membranes are moist.  Uvula is midline and  tonsils are flat.  No oropharyngeal lesions noted. CV:  Good peripheral perfusion.  RESP:  Normal effort.  ABD:  No distention.  SKIN:  Patient with generalized hyperpigmented macular rash, with coalescing lesions noted to the extremities and trunk.  The fascia is also was involved.  No desquamation, fissuring, or excoriations are noted.  No purulent drainage is noted.   ED Results / Procedures / Treatments   Labs (all labs ordered are listed, but only abnormal results are displayed) Labs Reviewed - No data to display   EKG   RADIOLOGY  No results found.   PROCEDURES:  Critical Care performed: No  Procedures   MEDICATIONS ORDERED IN ED: Medications  cyproheptadine (PERIACTIN) 4 MG tablet 4 mg (4 mg Oral Given 06/10/24 1803)     IMPRESSION / MDM / ASSESSMENT AND PLAN / ED COURSE  I reviewed the triage vital signs and the nursing notes.  Differential diagnosis includes, but is not limited to, drug rash, contact dermatitis, eczema exacerbation, vasculitis  Patient's presentation is most consistent with acute complicated illness / injury requiring diagnostic workup.  Patient's diagnosis is consistent with drug rash. Patient will be discharged home with prescriptions for Periactin.  Patient with reassuring exam and workup at this time.  No signs of any angioedema or anaphylaxis.  She reports having improvement of her itching with the change to cyproheptadine.  Patient is advised due to her history of poorly controlled diabetes that she may consider discontinuing the steroid at this time.  She has discontinued clindamycin as directed.  Patient stable for outpatient management and ongoing treatment of her drug rash.  She is advised that the drug rash is self-limited and should resolve in time with symptom relief provided by antihistamines.  Patient is to follow up with her PCP as needed or otherwise directed. Patient is given ED precautions to return  to the ED for any worsening or new symptoms.   FINAL CLINICAL IMPRESSION(S) / ED DIAGNOSES   Final diagnoses:  Allergic reaction to drug, subsequent encounter     Rx / DC Orders   ED Discharge Orders          Ordered    cyproheptadine (PERIACTIN) 4 MG tablet  3 times daily PRN        06/10/24 1824             Note:  This document was prepared using Dragon voice recognition software and may include unintentional dictation errors.    Loyd Candida LULLA Aldona, PA-C 06/11/24 0015    Waymond Lorelle Cummins, MD 06/11/24 [phone]

## 2024-06-10 NOTE — ED Triage Notes (Signed)
 Pt comes to the ED due to an allergic reaction. Pt was her two days ago for the same thing and was told to stop taking the clindamycin that was prescribed for cellulitis on the lower left leg. Pt has been taking steroids, hydroxyzine and prednisone. Pt sts that the rash is getting worse and deeper red in color. Pt has rash all over her abd area as well as chest, neck, face and scalp. Pt has no breathing difficulty at this time.

## 2024-06-10 NOTE — Discharge Instructions (Addendum)
 Your exam is reassuring.  Your rash appears stable at this time.  You may monitor your blood sugars as they would likely be elevated due to your recent steroid course and your generalized allergic reaction.  Take the antihistamine medicine as directed.  Follow-up with your primary provider for ongoing evaluation.  Return to the ED for difficulty breathing, swallowing, or controlling oral secretions.  Keep your skin moisturized to help prevent cracking and fissuring.  Su examen es tranquilizador. Su sarpullido parece estable en este momento. Puede controlar sus niveles de azcar en sangre, ya que probablemente estn elevados debido a su reciente tratamiento con esteroides y su reaccin alrgica generalizada. Tome el antihistamnico segn las indicaciones. Acuda a su mdico de cabecera para una evaluacin continua. Regrese a urgencias si tiene dificultad para respirar, tragar o controlar las secreciones orales. Mantenga la piel hidratada para prevenir grietas y fisuras. Es posible que pasen varios das o semanas hasta que la erupcin desaparezca por completo.

## 2024-06-17 ENCOUNTER — Encounter: Attending: Physician Assistant | Admitting: Physician Assistant

## 2024-06-17 DIAGNOSIS — I87312 Chronic venous hypertension (idiopathic) with ulcer of left lower extremity: Secondary | ICD-10-CM | POA: Diagnosis not present

## 2024-06-17 DIAGNOSIS — I10 Essential (primary) hypertension: Secondary | ICD-10-CM | POA: Diagnosis not present

## 2024-06-17 DIAGNOSIS — S80862A Insect bite (nonvenomous), left lower leg, initial encounter: Secondary | ICD-10-CM | POA: Insufficient documentation

## 2024-06-17 DIAGNOSIS — L97822 Non-pressure chronic ulcer of other part of left lower leg with fat layer exposed: Secondary | ICD-10-CM | POA: Insufficient documentation

## 2024-06-17 DIAGNOSIS — E11622 Type 2 diabetes mellitus with other skin ulcer: Secondary | ICD-10-CM | POA: Diagnosis present

## 2024-06-24 ENCOUNTER — Encounter: Attending: Internal Medicine | Admitting: Internal Medicine

## 2024-07-01 ENCOUNTER — Encounter: Admitting: Physician Assistant
